# Patient Record
Sex: Female | Born: 1965 | Race: Black or African American | Hispanic: No | Marital: Single | Smoking: Never smoker
Health system: Southern US, Community
[De-identification: ages and names within clinical notes are randomized; demographics above are authoritative.]

## PROBLEM LIST (undated history)

## (undated) DIAGNOSIS — G8929 Other chronic pain: Secondary | ICD-10-CM

## (undated) DIAGNOSIS — M1990 Unspecified osteoarthritis, unspecified site: Secondary | ICD-10-CM

## (undated) DIAGNOSIS — I10 Essential (primary) hypertension: Secondary | ICD-10-CM

## (undated) DIAGNOSIS — K2960 Other gastritis without bleeding: Secondary | ICD-10-CM

## (undated) DIAGNOSIS — M549 Dorsalgia, unspecified: Secondary | ICD-10-CM

## (undated) HISTORY — PX: ABDOMINAL HYSTERECTOMY: SHX81

---

## 2003-11-30 ENCOUNTER — Emergency Department: Payer: Self-pay | Admitting: General Practice

## 2003-12-07 ENCOUNTER — Ambulatory Visit: Payer: Self-pay | Admitting: Orthopedic Surgery

## 2004-05-05 ENCOUNTER — Emergency Department: Payer: Self-pay | Admitting: Emergency Medicine

## 2004-08-07 ENCOUNTER — Emergency Department: Payer: Self-pay | Admitting: Emergency Medicine

## 2004-08-08 ENCOUNTER — Ambulatory Visit: Payer: Self-pay | Admitting: Emergency Medicine

## 2004-08-13 ENCOUNTER — Emergency Department: Payer: Self-pay | Admitting: Unknown Physician Specialty

## 2004-08-24 ENCOUNTER — Inpatient Hospital Stay: Payer: Self-pay | Admitting: Obstetrics and Gynecology

## 2004-10-03 ENCOUNTER — Inpatient Hospital Stay: Payer: Self-pay

## 2004-11-15 ENCOUNTER — Inpatient Hospital Stay: Payer: Self-pay | Admitting: Obstetrics and Gynecology

## 2005-02-14 ENCOUNTER — Ambulatory Visit: Payer: Self-pay | Admitting: Obstetrics and Gynecology

## 2005-04-05 ENCOUNTER — Emergency Department: Payer: Self-pay | Admitting: Emergency Medicine

## 2005-05-07 ENCOUNTER — Emergency Department: Payer: Self-pay | Admitting: Emergency Medicine

## 2005-05-15 ENCOUNTER — Emergency Department: Payer: Self-pay | Admitting: Emergency Medicine

## 2005-10-07 ENCOUNTER — Emergency Department: Payer: Self-pay | Admitting: Internal Medicine

## 2005-10-08 ENCOUNTER — Ambulatory Visit: Payer: Self-pay

## 2005-11-12 ENCOUNTER — Inpatient Hospital Stay: Payer: Self-pay | Admitting: Obstetrics and Gynecology

## 2005-12-31 ENCOUNTER — Emergency Department: Payer: Self-pay | Admitting: Internal Medicine

## 2005-12-31 ENCOUNTER — Other Ambulatory Visit: Payer: Self-pay

## 2007-03-05 ENCOUNTER — Emergency Department: Payer: Self-pay | Admitting: Emergency Medicine

## 2007-03-06 ENCOUNTER — Ambulatory Visit: Payer: Self-pay | Admitting: Emergency Medicine

## 2007-04-13 ENCOUNTER — Emergency Department: Payer: Self-pay | Admitting: Emergency Medicine

## 2007-10-21 ENCOUNTER — Emergency Department: Payer: Self-pay | Admitting: Emergency Medicine

## 2008-01-02 ENCOUNTER — Emergency Department: Payer: Self-pay | Admitting: Emergency Medicine

## 2008-08-10 ENCOUNTER — Emergency Department: Payer: Self-pay | Admitting: Emergency Medicine

## 2008-08-13 ENCOUNTER — Ambulatory Visit: Payer: Self-pay | Admitting: Internal Medicine

## 2009-03-12 ENCOUNTER — Emergency Department: Payer: Self-pay | Admitting: Emergency Medicine

## 2009-06-22 ENCOUNTER — Emergency Department: Payer: Self-pay | Admitting: Emergency Medicine

## 2009-06-26 ENCOUNTER — Emergency Department: Payer: Self-pay | Admitting: Emergency Medicine

## 2009-07-01 ENCOUNTER — Ambulatory Visit: Payer: Self-pay | Admitting: Internal Medicine

## 2009-08-10 ENCOUNTER — Emergency Department: Payer: Self-pay | Admitting: Emergency Medicine

## 2009-10-20 ENCOUNTER — Emergency Department: Payer: Self-pay | Admitting: Emergency Medicine

## 2010-01-13 ENCOUNTER — Emergency Department: Payer: Self-pay | Admitting: Emergency Medicine

## 2010-03-01 ENCOUNTER — Emergency Department: Payer: Self-pay | Admitting: Emergency Medicine

## 2010-07-30 ENCOUNTER — Emergency Department: Payer: Self-pay | Admitting: Emergency Medicine

## 2011-02-20 LAB — COMPREHENSIVE METABOLIC PANEL
Albumin: 3.6 g/dL (ref 3.4–5.0)
Alkaline Phosphatase: 63 U/L (ref 50–136)
Calcium, Total: 8.9 mg/dL (ref 8.5–10.1)
Co2: 26 mmol/L (ref 21–32)
EGFR (African American): >60
EGFR (Non-African Amer.): >60
Glucose: 86 mg/dL (ref 65–99)
Osmolality: 283 (ref 275–301)
SGOT(AST): 14 U/L — ABNORMAL LOW (ref 15–37)
SGPT (ALT): 16 U/L

## 2011-02-20 LAB — URINALYSIS, COMPLETE
Bacteria: NONE SEEN
Glucose,UR: NEGATIVE mg/dL (ref 0–75)
Ketone: NEGATIVE
Nitrite: NEGATIVE
RBC,UR: 1 /HPF (ref 0–5)
Specific Gravity: 1.015 (ref 1.003–1.030)
WBC UR: 4 /HPF (ref 0–5)

## 2011-02-20 LAB — CBC
HCT: 37.4 % (ref 35.0–47.0)
HGB: 12 g/dL (ref 12.0–16.0)
MCHC: 32 g/dL (ref 32.0–36.0)
MCV: 79 fL — ABNORMAL LOW (ref 80–100)
Platelet: 370 10*3/uL (ref 150–440)
RBC: 4.76 10*6/uL (ref 3.80–5.20)
WBC: 9.4 10*3/uL (ref 3.6–11.0)

## 2011-02-20 LAB — LIPASE, BLOOD: Lipase: 109 U/L (ref 73–393)

## 2011-02-21 ENCOUNTER — Observation Stay: Payer: Self-pay | Admitting: Internal Medicine

## 2011-02-21 LAB — RAPID INFLUENZA A&B ANTIGENS

## 2011-02-23 LAB — CBC WITH DIFFERENTIAL/PLATELET
Basophil #: 0 10*3/uL (ref 0.0–0.1)
Basophil %: 0.5 %
Eosinophil %: 1.3 %
HCT: 36 % (ref 35.0–47.0)
HGB: 11.6 g/dL — ABNORMAL LOW (ref 12.0–16.0)
MCH: 25.4 pg — ABNORMAL LOW (ref 26.0–34.0)
MCHC: 32.3 g/dL (ref 32.0–36.0)
MCV: 79 fL — ABNORMAL LOW (ref 80–100)
Monocyte #: 0.6 10*3/uL (ref 0.0–0.7)
Monocyte %: 8.4 %
Neutrophil %: 58.6 %
Platelet: 330 10*3/uL (ref 150–440)
RBC: 4.58 10*6/uL (ref 3.80–5.20)
WBC: 7.4 10*3/uL (ref 3.6–11.0)

## 2011-02-23 LAB — BASIC METABOLIC PANEL
Calcium, Total: 8.8 mg/dL (ref 8.5–10.1)
Chloride: 108 mmol/L — ABNORMAL HIGH (ref 98–107)
Co2: 27 mmol/L (ref 21–32)
Osmolality: 285 (ref 275–301)
Potassium: 4 mmol/L (ref 3.5–5.1)
Sodium: 145 mmol/L (ref 136–145)

## 2011-02-24 LAB — URINE CULTURE

## 2014-05-09 NOTE — Consult Note (Signed)
PATIENT NAME:  Linda Giles, Linda Giles MR#:  [id number] DATE OF BIRTH:  10-18-1965  DATE OF CONSULTATION:  02/21/2011  REFERRING PHYSICIAN:   CONSULTING PHYSICIAN:  Keturah Barrehristiane H. Yenifer Saccente, NP  PRIMARY CARE PHYSICIAN: None.  HISTORY OF PRESENT ILLNESS: Linda Giles is a 49 year old African American lady who has recently been admitted with nausea, vomiting and diarrhea. Please see the History and Physical for full admit details. Gastroenterology has been requested by Dr. Hilda LiasVivek Sainani for evaluation of her nausea, vomiting, diarrhea, and abdominal pain. The patient reports an onset of fatigue and malaise about a week ago, low-grade fever on Saturday and Sunday. Diarrhea started Saturday evening. She states she thought she had eaten something bad, but then started vomiting later that night which has continued until this morning, with the diarrhea. She states that her vomit was dark brown and her stools were dark as well with flecks of blood. The blood was last seen Sunday. She reports generalized abdominal pain that is throbbing in nature. No fever today. Reports that she had [employer] on Thursday and [employer] on Saturday. She uses city water and has a dog. No recent antibiotics or foreign travel. She the Production designer, theatre/television/filmmanager at [employer]. She does report limited but regular use of ibuprofen for intermittent stress headaches. Also reports heartburn, acid reflux, burping, and decreased appetite. She has not been using PPI at home for some time. She has not had endoscopic evaluation in past. Additionally, her mother was diagnosed early with colon cancer, had resection, and did not require chemotherapy. She is still living. I noted that stool studies has been ordered to include C. difficile, ova and parasite, and culture. I also noted pantoprazole IV twice a day and IV Zofran. In review of her labs, they are essentially normal, however, on her CBC she does have some microcytosis. Flu test was negative for flu A and flu B. CT  without contrast did not demonstrate any acute problems and the chest x-ray found no abnormalities   ALLERGIES: Compazine   PAST MEDICAL HISTORY:  1. Hypertension. 2. Depression. 3. Panic attacks. 4. Hysterectomy.   SOCIAL HISTORY: No smoking, EtOH, or illicits. Works as a Production designer, theatre/television/filmmanager at [employer].   FAMILY HISTORY: History is significant for colon cancer in her mother who was diagnosed 20 years ago. Also had peptic ulcer disease. No known history of liver disease or IBD. Mother additionally with cervical cancer.   CURRENT MEDICATIONS: None, she was on antidepressants and antihypertensives, but is not taking them now.   REVIEW OF SYSTEMS: CONSTITUTIONAL: Weight stable. Fatigue, malaise, and fever as noted. HEENT: No blurry or double vision, tinnitus, postnasal drip, sore throat, or difficulty swallowing. RESPIRATORY: No cough, wheeze, or dyspnea, CARDIOVASCULAR: No chest pain, palpitations, murmur, or syncope. GASTROINTESTINAL: As noted. GU: No dysuria or hematuria. ENDOCRINE: No history of diabetes, polyuria, nocturia, or hypothyroidism. No hot or cold intolerances. HEME: No known history of anemia, easy bruising, or bleeding. INTEGUMENTARY: No rash or lesions. MUSCULOSKELETAL: No history of arthritis, swelling or gout. NEUROLOGIC: No history of CVA, numbness, tingling, fainting, dizziness, or seizures. PSYCH: No history of anxiety or insomnia. Does have history of depression and does not have any complaints about this at this time.   PHYSICAL EXAM:   VITALS: Temperature 96.5, pulse 62, respiratory rate 18, blood pressure 133/67, and oxygen saturation 98% on room air.   GENERAL: Pleasant-appearing woman in no acute distress.   HEENT: Normocephalic, atraumatic. No redness, drainage, or inflammation to the eyes or the nares. No icterus  to the sclerae. Oral membranes are pink and moist.   PSYCH: Pleasant, logical thought, cooperative.   NECK: Supple. No JVD, lymphadenopathy, or thyromegaly.    PULMONARY: Respirations eupneic. Lungs CTAB.   CARDIAC: S1 and S2, regular rate and rhythm. No MRG. Peripheral pluses 2+ bilaterally. No edema.   ABDOMEN: Protuberant, hypoactive bowel sounds x4, soft, and generalized diffuse tenderness without rebound tenderness, peritoneal signs, hepatosplenomegaly, hernias, or masses.   RECTAL: No abnormalities. Stool brown, heme-negative.   GU: Normal appearing female genitalia.   SKIN: Warm, dry, and pink. No erythema, lesion, or rash.   NEUROLOGICAL: Alert and oriented x3. Cranial nerves II through XII grossly intact. No facial droop. Speech clear.   EXTREMITIES: No cyanosis, clubbing, or peripheral edema. Gait steady. Strength 5/5. Sensation intact.   IMPRESSION AND PLAN:  1. Likely gastroenteritis. This could have a viral versus a bacterial component. Noted stool studies pending and agree with the fluids and antiemetics. We will rechallenge with a clear liquid diet. 2. Gastroesophageal reflux. Agree with bid IV pantoprazole. With this, the NSAIDs, and the family history of colon cancer we will plan for outpatient luminal evaluation once the patient is feeling better.   These services were provided by Vevelyn Pat, MSN, NPC in collaboration with Christena Deem, MD.  ____________________________ Keturah Barre, NP chl:slb D: 02/21/2011 17:23:49 ET T: 02/21/2011 17:38:04 ET JOB#: [id number]  cc: Keturah Barre, NP, <Dictator> Eustaquio Maize Mercadez Heitman FNP ELECTRONICALLY SIGNED 02/22/2011 13:48

## 2014-05-09 NOTE — Discharge Summary (Signed)
PATIENT NAME:  Linda Giles, Linda Giles#:  [id number] DATE OF BIRTH:  Apr 03, 1965  DATE OF ADMISSION:  02/21/2011 DATE OF DISCHARGE:  02/23/2011  ADMITTING DIAGNOSES: Abdominal pain, nausea, vomiting and diarrhea.   DISCHARGE DIAGNOSES:  1. Abdominal pain, nausea, vomiting and diarrhea, possibly due to acute gastroenteritis with gastritis, now symptoms improved.  2. History of hypertension, currently not on any antihypertensives, blood pressure has been stable.  3. History of depression, currently not in any depression medications.  4. History of panic attacks.   LABORATORY, DIAGNOSTIC AND RADIOLOGICAL DATA: Serum glucose 86, BUN 10, creatinine 0.8, sodium 143, potassium 3.7, chloride 105, bicarbonate 26. LFTs normal. WBC 9.4, hemoglobin 12, platelet count 370. Urinalysis showed 2+ leukocyte esterase with only 4 WBCs. Urine culture showed no growth. CT of the abdomen and pelvis showed no urolithiasis. No acute abdominal or pelvic pathology. PA and lateral chest x-ray showed no acute abnormality. Lipase 109.   CONSULTANT: GI, Dr. Marva Giles.   HOSPITAL COURSE: Please see history and physical done by the admitting physician. Patient is a 49 year old African American female came into the ED with complaint of nausea, vomiting, and diarrhea. Patient reports that these episodes started for the past 2 to 3 days. She also had abdominal pain therefore was evaluated in the ED with a CT of the abdomen which was negative. She had LFTs that were normal. Lipase was normal. Due to her symptoms, we were asked to admit the patient. Patient was admitted and was provided with supportive care, antiemetics, IV fluids. She was seen by GI. They recommended continuing supportive care. Patient's symptoms started to improve, her diarrhea is resolved, her nausea and emesis is improved. She is able to tolerate some clears. We are going to advance her diet, if she is able to tolerate that she will be discharged later today.    DISCHARGE MEDICATIONS:  1. Ultram 50 q.4 p.r.n. for pain. 2. Dicyclomine 20, 1 tab p.o. 4 times per day p.r.n.  3. Zofran 4 mg t.i.d. as needed. 4. Phenergan 12.5 mg p.o. every six hours p.r.n. nausea.  5. Percocet 5/325 p.o. q.8 p.r.n. pain. 6. Esomeprazole 20 mg p.o. daily.   DISCHARGE DIET: Low sodium, low fat.   ACTIVITY: As tolerated.   TIMEFRAME FOR FOLLOW UP: 1 to 2 weeks with [facility] as a new patient.   TIME SPENT: 35 minutes.  ____________________________ Lacie ScottsShreyang H. Allena KatzPatel, MD shp:cms D: 02/23/2011 13:00:25 ET T: 02/23/2011 13:16:37 ET JOB#: [id number]  cc: Katelee Schupp H. Allena KatzPatel, MD, <Dictator> Charise CarwinSHREYANG H Moshe Wenger MD ELECTRONICALLY SIGNED 03/03/2011 10:03

## 2014-05-09 NOTE — Consult Note (Signed)
Chief Complaint:   Subjective/Chief Complaint Patient seen and examined, please seee full GI consult.  Patietn admitted with diarrheal illness/ n/v.  Feeling some better, no emesis since this am no diarrhea today.  Awaiting stool studies.  Of note patient has some microcytosis with borderline hemoglobin, in the setting of family history of colon cancer in her moter at a young age.  REcommend luminal evaluation  once feeling some better, can be done as outpatient. Following.   Electronic Signatures: Barnetta ChapelSkulskie, Armonie Mettler (MD)  (Signed 06-Feb-13 20:59)  Authored: Chief Complaint   Last Updated: 06-Feb-13 20:59 by Barnetta ChapelSkulskie, Tonika Eden (MD)

## 2014-05-09 NOTE — Consult Note (Signed)
Brief Consult Note: Diagnosis: NVD.   Patient was seen by consultant.   Consult note dictated.   Discussed with Attending MD.   Comments: Appreciate consult for 49 y/o [place] woman admitted with NVD and abdominal pain for same. Reports onset of fatigue/malaise about a week ago, low grade fever Saturday and Sunday. States diarrhea started Saturday evening: thought she had eaten something bad, but then started vomiting later that nigh, which has continued until this morning. States that her vomit was dark brown, and her stools was dark as well with flecks of blood, last seen Sunday. States abdominal pain is generalized and throbbing in nature. No fever today. States she had [employer] on Thursday, [employer] Saturday. Uses city water, has a dog, no recent antibiotics or foreign travel. 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 Does have some heartburn, acid reflux, burping, and decreased appetite. Has not been using PPI at home for some time. Mother diagnosed early with colon cancer, had resection, did not require chemo, and is still living. Stool brownish, heme negative today. Impression: gastroenteritis- noted stool studies pending and agree with this, fluids, antiemetics. this could have a viral v. bacterial component. Will rechallenge with CLIQ diet.                   Gerd: agree with bid IV pantoprazole. With this, NSAIDs, and family history of colon cancer, will plan for outpt luminal evaluation when patient feeling better  Electronic Signatures: Vevelyn PatLondon, Eunice Winecoff H (NP)  (Signed 06-Feb-13 17:14)  Authored: Brief Consult Note   Last Updated: 06-Feb-13 17:14 by Keturah BarreLondon, Arionne Iams H (NP)

## 2014-05-09 NOTE — H&P (Signed)
PATIENT NAME:  Linda Giles, Linda Giles MR#:  [id number] DATE OF BIRTH:  08-28-1965  DATE OF ADMISSION:  02/21/2011  PRIMARY CARE PHYSICIAN: Does not have one.   CHIEF COMPLAINT: Abdominal pain, nausea, and vomiting with also diarrhea.   HISTORY OF PRESENT ILLNESS: This is a 49 year old female who comes in from home complaining of nausea, vomiting, and diarrhea. The patient says that she has had multiple episodes of nausea and vomiting since the past 2 or 3 days. She says it has no relation to when she eats or if she does not eat. Every time she tries to eat, even liquids, it comes right back up. She has not been able to keep anything down for the past 2 to 3 days. She also complains of abdominal pain mainly in the lower abdomen described as sharp in nature, nonradiating. She also admits to diarrhea which has been loose and watery in nature. She also admits to some specks of blood in her stool. Given the fact that her symptoms are not improving, she came to the ER for further evaluation. In the Emergency Room, the patient has received multiple doses of antiemetics and also pain control with not much improvement in her symptoms. Hospitalist service was then contacted for further treatment and evaluation.   REVIEW OF SYSTEMS: CONSTITUTIONAL: No documented fever. No weight gain, no weight loss. EYES: No blurry or double vision. ENT: No tinnitus or postnasal drip. No redness of the oropharynx. RESPIRATORY: No cough, no wheeze, no hemoptysis, no dyspnea. CARDIOVASCULAR: No chest pain, no orthopnea, no palpitations, no syncope. GI: Positive nausea. Positive vomiting. Positive generalized abdominal pain. No melena. No hematochezia. GU: No dysuria or hematuria. ENDOCRINE: No polyuria or nocturia. No heat or cold intolerance. HEME: No anemia. No bruising. No bleeding. INTEGUMENTARY: No rashes. No lesions. MUSCULOSKELETAL: No arthritis, no swelling, no gout. NEUROLOGIC: No numbness, no tingling, no ataxia, no seizure-type  activity. PSYCH: No anxiety, no insomnia, no ADD.   PAST MEDICAL HISTORY:  1. Hypertension.  2. Depression. 3. Panic attacks.   ALLERGIES: Compazine.   SOCIAL HISTORY: No smoking. No alcohol abuse. No illicit drug abuse. 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   FAMILY HISTORY: Family history is strong for diabetes on the mother's side and also history of cancer. Mother has colon cancer and history of cervical cancer but is still alive.   CURRENT MEDICATIONS: The patient is supposed to be taking medications for blood pressure and depression although she has not taken them in quite a while.   PHYSICAL EXAMINATION ON ADMISSION:   VITAL SIGNS: Temperature 99, pulse 51, respirations 16, blood pressure 140/70, sats 99% on room air.   GENERAL: She is a pleasant appearing female in no apparent distress.   HEENT: Atraumatic, normocephalic. Extraocular muscles are intact. Pupils are equal and reactive to light. Sclerae anicteric. No conjunctival injection. No oropharyngeal erythema.   NECK: Supple. No jugular venous distention. No bruits. No lymphadenopathy or thyromegaly.   HEART: Regular rate and rhythm. No murmurs, no rubs, no clicks.   LUNGS: Clear to auscultation bilaterally. No rales, no rhonchi, no wheezes.   ABDOMEN: Soft, tender diffusely. No rebound. No rigidity. Hypoactive bowel sounds. No hepatosplenomegaly appreciated.   EXTREMITIES: No evidence of any cyanosis, clubbing, or peripheral edema. Has +2 pedal and radial pulses bilaterally.   NEUROLOGICAL: The patient is alert, awake, and oriented x3 with no focal motor or sensory deficits appreciated.   SKIN: Moist and warm with no rash appreciated.   LYMPHATIC:  There is no cervical or axillary lymphadenopathy.    LABORATORY, DIAGNOSTIC, AND RADIOLOGICAL DATA: Serum glucose 86, BUN 10, creatinine 0.8, sodium 143, potassium 3.7, chloride 105, bicarb 26. LFTs are within normal limits. White cell count 9.4, hemoglobin  12, hematocrit 37.4, platelet count 370. Urinalysis shows 2+ leukocyte esterase with 4 white cells, no bacteria. The patient did undergo a CT scan of the abdomen and pelvis without contrast which showed no urolithiasis, no obstructive uropathy. No acute abdominal or pelvic pathology.   ASSESSMENT AND PLAN: This is a 49 year old female with past medical history of hypertension, anxiety/depression who presents to the hospital with abdominal pain, nausea, vomiting, and diarrhea.  1. Abdominal pain, nausea, and vomiting. The exact etiology of this is unclear, questionable if this is a viral illness versus irritable bowel syndrome versus colitis. CT scan of the abdomen and pelvis without contrast does not show any acute pathology. Her lipase is normal. Her LFTs are normal. The patient has gotten multiple doses of antiemetics and pain meds but still remains symptomatic. I will observe her overnight. Continue supportive care with IV fluids, antiemetics, and pain control. Will get a GI consult. Will put her on Protonix b.i.d. for now.  2. Diarrhea. Supportive care for now with some p.r.n. Imodium. Will check stool for comprehensive culture, ova and parasites, and C. difficile.  3. Hypertension. The patient presently is not on any meds. Will continue p.r.n. hydralazine for now.  4. Anxiety/depression. The patient is currently on no meds. She denies any suicidal or homicidal ideations presently.   CODE STATUS: The patient is a FULL CODE.   TIME SPENT: 45 minutes.   ____________________________ Rolly Pancake. Cherlynn Kaiser, MD vjs:drc D: 02/21/2011 08:37:07 ET T: 02/21/2011 08:52:59 ET JOB#: [id number]  cc: Rolly Pancake. Cherlynn Kaiser, MD, <Dictator> Houston Siren MD ELECTRONICALLY SIGNED 02/21/2011 11:55

## 2015-08-30 ENCOUNTER — Emergency Department: Payer: Self-pay

## 2015-08-30 ENCOUNTER — Emergency Department
Admission: EM | Admit: 2015-08-30 | Discharge: 2015-08-30 | Disposition: A | Discharge status: Home / Self Care | Payer: Self-pay | Attending: Emergency Medicine | Admitting: Emergency Medicine

## 2015-08-30 ENCOUNTER — Encounter: Payer: Self-pay | Admitting: Emergency Medicine

## 2015-08-30 DIAGNOSIS — M546 Pain in thoracic spine: Secondary | ICD-10-CM

## 2015-08-30 DIAGNOSIS — I10 Essential (primary) hypertension: Secondary | ICD-10-CM | POA: Insufficient documentation

## 2015-08-30 DIAGNOSIS — Y929 Unspecified place or not applicable: Secondary | ICD-10-CM | POA: Insufficient documentation

## 2015-08-30 DIAGNOSIS — X58XXXA Exposure to other specified factors, initial encounter: Secondary | ICD-10-CM | POA: Insufficient documentation

## 2015-08-30 DIAGNOSIS — T148XXA Other injury of unspecified body region, initial encounter: Secondary | ICD-10-CM

## 2015-08-30 DIAGNOSIS — Y999 Unspecified external cause status: Secondary | ICD-10-CM | POA: Insufficient documentation

## 2015-08-30 DIAGNOSIS — Y939 Activity, unspecified: Secondary | ICD-10-CM | POA: Insufficient documentation

## 2015-08-30 DIAGNOSIS — S29012A Strain of muscle and tendon of back wall of thorax, initial encounter: Secondary | ICD-10-CM | POA: Insufficient documentation

## 2015-08-30 HISTORY — DX: Essential (primary) hypertension: I10

## 2015-08-30 MED ORDER — CYCLOBENZAPRINE HCL 10 MG PO TABS
10.0000 mg | ORAL_TABLET | Freq: Once | ORAL | Status: AC
  Administered 2015-08-30: 10 mg via ORAL
  Filled 2015-08-30: qty 1

## 2015-08-30 MED ORDER — CYCLOBENZAPRINE HCL 5 MG PO TABS: 5.0000 mg | ORAL_TABLET | Freq: Three times a day (TID) | ORAL | 0 refills | Status: DC | PRN

## 2015-08-30 NOTE — ED Triage Notes (Signed)
Presents with mid back pain off and on for a while  Became worse of Friday  States pain radiates into right leg  Ambulates with sl limp d/t pain.no injury

## 2015-08-30 NOTE — ED Provider Notes (Signed)
[facility]-EMERGENCY DEPARTMENT Provider Note   CSN: [id number] Arrival date & time: 08/30/15  1739     History   Chief Complaint Chief Complaint  Patient presents with  . Back Pain    HPI Linda Giles is a 50 y.o. female.Presents to the emergency department for evaluation of lower back pain. She points to the mid thoracic spine on the spinous process of T8-T9. She's had been tightness and muscle spasms. No trauma or injury. Her symptoms been on and off for 2 weeks. She works at assisted living facility, performing a lot of lifting. She denies any numbness tingling radicular symptoms or weakness of lower extremities. No loss of bowel or bladder symptoms. [facility], prescribed ibuprofen, no imaging was obtained. She has not been taking the ibuprofen. Her pain is described as 10 out of 10 and muscle spasms and increased with movement  HPI  Past Medical History:  Diagnosis Date  . Hypertension     There are no active problems to display for this patient.   Past Surgical History:  Procedure Laterality Date  . ABDOMINAL HYSTERECTOMY      OB History    No data available       Home Medications    Prior to Admission medications   Medication Sig Start Date End Date Taking? Authorizing Provider  cyclobenzaprine (FLEXERIL) 5 MG tablet Take 1-2 tablets (5-10 mg total) by mouth 3 (three) times daily as needed for muscle spasms. 08/30/15   Evon Slackhomas C Ala Kratz, PA-C    Family History No family history on file.  Social History Social History  Substance Use Topics  . Smoking status: Never Smoker  . Smokeless tobacco: Never Used  . Alcohol use No     Allergies   Compazine [prochlorperazine edisylate]   Review of Systems Review of Systems  Constitutional: Negative for activity change, chills, fatigue and fever.  HENT: Negative for congestion, sinus pressure and sore throat.   Eyes: Negative for visual disturbance.  Respiratory: Negative for cough, chest  tightness and shortness of breath.   Cardiovascular: Negative for chest pain and leg swelling.  Gastrointestinal: Negative for abdominal pain, diarrhea, nausea and vomiting.  Genitourinary: Negative for dysuria.  Musculoskeletal: Positive for back pain. Negative for arthralgias and gait problem.  Skin: Negative for rash.  Neurological: Negative for weakness, numbness and headaches.  Hematological: Negative for adenopathy.  Psychiatric/Behavioral: Negative for agitation, behavioral problems and confusion.     Physical Exam Updated Vital Signs BP (!) 157/83 (BP Location: Left Arm)   Pulse 79   Temp 98.4 F (36.9 C) (Oral)   Resp 18   Ht 5\' 6"  (1.676 m)   Wt 88.5 kg   SpO2 99%   BMI 31.47 kg/m   Physical Exam  Constitutional: She is oriented to person, place, and time. She appears well-developed and well-nourished. No distress.  HENT:  Head: Normocephalic and atraumatic.  Mouth/Throat: Oropharynx is clear and moist.  Eyes: EOM are normal. Pupils are equal, round, and reactive to light. Right eye exhibits no discharge. Left eye exhibits no discharge.  Neck: Normal range of motion. Neck supple.  Cardiovascular: Normal rate, regular rhythm and intact distal pulses.   Pulmonary/Chest: Effort normal and breath sounds normal. No respiratory distress. She exhibits no tenderness.  Abdominal: Soft. She exhibits no distension. There is no tenderness.  Musculoskeletal: Normal range of motion. She exhibits no edema.  Examination of thoracic and lumbar spine shows patient has spinous process tenderness at T8-T9.  There is left and right paravertebral muscle tenderness that is mild. There is no muscle spasms noted. Patient has full range of motion of lumbar thoracic and cervical spine. She has full range of motion of the lower extremities with no weakness or neurological deficits.  Neurological: She is alert and oriented to person, place, and time. She has normal reflexes.  Skin: Skin is warm and  dry.  Psychiatric: She has a normal mood and affect. Her behavior is normal. Thought content normal.     ED Treatments / Results  Labs (all labs ordered are listed, but only abnormal results are displayed) Labs Reviewed - No data to display  EKG  EKG Interpretation None       Radiology Dg Thoracic Spine 2 View  Result Date: 08/30/2015 CLINICAL DATA:  Mid back pain off and on for awhile but worsening last Friday. Radiates to right leg. EXAM: THORACIC SPINE 2 VIEWS COMPARISON:  07/30/2010 FINDINGS: There is no evidence of thoracic spine fracture. Alignment is normal. No other significant bone abnormalities are identified. IMPRESSION: Negative. Electronically Signed   By: Kennith CenterEric  Mansell M.D.   On: 08/30/2015 19:03    Procedures Procedures (including critical care time)  Medications Ordered in ED Medications  cyclobenzaprine (FLEXERIL) tablet 10 mg (10 mg Oral Given 08/30/15 1842)     Initial Impression / Assessment and Plan / ED Course  I have reviewed the triage vital signs and the nursing notes.  Pertinent labs & imaging results that were available during my care of the patient were reviewed by me and considered in my medical decision making (see chart for details).  Clinical Course    50 year old female with 2 weeks of mid thoracic back pain with left and right paravertebral muscle tenderness. [facility], no imaging obtained. Today's x-ray showed no evidence of acute bony abnormality or compression deformity. Recommend she start ibuprofen [facility], she is given a muscle relaxer today. She will follow-up with orthopedics if no improvement in 5-7 days.  Final Clinical Impressions(s) / ED Diagnoses   Final diagnoses:  Muscle strain  Midline thoracic back pain    New Prescriptions New Prescriptions   CYCLOBENZAPRINE (FLEXERIL) 5 MG TABLET    Take 1-2 tablets (5-10 mg total) by mouth 3 (three) times daily as needed for muscle spasms.       Evon Slackhomas C Aidenn Skellenger, PA-C 08/30/15 1942    Jennye MoccasinBrian S Quigley, MD 08/31/15 [phone]

## 2016-02-11 ENCOUNTER — Emergency Department: Payer: Self-pay

## 2016-02-11 ENCOUNTER — Observation Stay
Admission: EM | Admit: 2016-02-11 | Discharge: 2016-02-12 | Disposition: A | Discharge status: Home / Self Care | Payer: Self-pay | Attending: Internal Medicine | Admitting: Internal Medicine

## 2016-02-11 ENCOUNTER — Encounter: Payer: Self-pay | Admitting: Emergency Medicine

## 2016-02-11 DIAGNOSIS — E785 Hyperlipidemia, unspecified: Secondary | ICD-10-CM | POA: Insufficient documentation

## 2016-02-11 DIAGNOSIS — Z7982 Long term (current) use of aspirin: Secondary | ICD-10-CM | POA: Insufficient documentation

## 2016-02-11 DIAGNOSIS — R0789 Other chest pain: Principal | ICD-10-CM | POA: Insufficient documentation

## 2016-02-11 DIAGNOSIS — Z90710 Acquired absence of both cervix and uterus: Secondary | ICD-10-CM | POA: Insufficient documentation

## 2016-02-11 DIAGNOSIS — R52 Pain, unspecified: Secondary | ICD-10-CM

## 2016-02-11 DIAGNOSIS — Z8249 Family history of ischemic heart disease and other diseases of the circulatory system: Secondary | ICD-10-CM | POA: Insufficient documentation

## 2016-02-11 DIAGNOSIS — I10 Essential (primary) hypertension: Secondary | ICD-10-CM | POA: Insufficient documentation

## 2016-02-11 DIAGNOSIS — R079 Chest pain, unspecified: Secondary | ICD-10-CM | POA: Diagnosis present

## 2016-02-11 LAB — CBC WITH DIFFERENTIAL/PLATELET
BASOS ABS: 0.1 10*3/uL (ref 0–0.1)
Basophils Relative: 1 %
EOS PCT: 1 %
Eosinophils Absolute: 0.1 10*3/uL (ref 0–0.7)
HEMATOCRIT: 36.5 % (ref 35.0–47.0)
Hemoglobin: 12.2 g/dL (ref 12.0–16.0)
LYMPHS ABS: 2.1 10*3/uL (ref 1.0–3.6)
LYMPHS PCT: 21 %
MCH: 25.6 pg — AB (ref 26.0–34.0)
MCHC: 33.5 g/dL (ref 32.0–36.0)
MCV: 76.5 fL — AB (ref 80.0–100.0)
MONO ABS: 0.6 10*3/uL (ref 0.2–0.9)
MONOS PCT: 6 %
NEUTROS ABS: 6.8 10*3/uL — AB (ref 1.4–6.5)
Neutrophils Relative %: 71 %
Platelets: 398 10*3/uL (ref 150–440)
RBC: 4.77 MIL/uL (ref 3.80–5.20)
RDW: 14.4 % (ref 11.5–14.5)
WBC: 9.6 10*3/uL (ref 3.6–11.0)

## 2016-02-11 LAB — BASIC METABOLIC PANEL
ANION GAP: 8 (ref 5–15)
BUN: 10 mg/dL (ref 6–20)
CALCIUM: 9.3 mg/dL (ref 8.9–10.3)
CO2: 30 mmol/L (ref 22–32)
Chloride: 100 mmol/L — ABNORMAL LOW (ref 101–111)
Creatinine, Ser: 1.08 mg/dL — ABNORMAL HIGH (ref 0.44–1.00)
GFR calc Af Amer: >60 mL/min (ref >60)
GFR calc non Af Amer: 60 mL/min — ABNORMAL LOW (ref >60)
GLUCOSE: 110 mg/dL — AB (ref 65–99)
Potassium: 3.2 mmol/L — ABNORMAL LOW (ref 3.5–5.1)
Sodium: 138 mmol/L (ref 135–145)

## 2016-02-11 LAB — MAGNESIUM: Magnesium: 2 mg/dL (ref 1.7–2.4)

## 2016-02-11 LAB — TROPONIN I
Troponin I: <0.03 ng/mL (ref <0.03)
Troponin I: <0.03 ng/mL (ref <0.03)
Troponin I: <0.03 ng/mL (ref <0.03)

## 2016-02-11 MED ORDER — ONDANSETRON HCL 4 MG/2ML IJ SOLN: 4.0000 mg | Freq: Four times a day (QID) | INTRAMUSCULAR | Status: DC | PRN

## 2016-02-11 MED ORDER — POTASSIUM CHLORIDE CRYS ER 20 MEQ PO TBCR
20.0000 meq | EXTENDED_RELEASE_TABLET | Freq: Two times a day (BID) | ORAL | Status: DC
  Administered 2016-02-11 – 2016-02-12 (×3): 20 meq via ORAL
  Filled 2016-02-11 (×3): qty 1

## 2016-02-11 MED ORDER — NITROGLYCERIN 0.4 MG SL SUBL: 0.4000 mg | SUBLINGUAL_TABLET | SUBLINGUAL | Status: DC | PRN

## 2016-02-11 MED ORDER — METOPROLOL TARTRATE 25 MG PO TABS
25.0000 mg | ORAL_TABLET | Freq: Two times a day (BID) | ORAL | Status: DC
  Administered 2016-02-12: 25 mg via ORAL
  Filled 2016-02-11: qty 1

## 2016-02-11 MED ORDER — ASPIRIN 81 MG PO CHEW
324.0000 mg | CHEWABLE_TABLET | Freq: Once | ORAL | Status: AC
  Administered 2016-02-11: 324 mg via ORAL
  Filled 2016-02-11: qty 4

## 2016-02-11 MED ORDER — ENOXAPARIN SODIUM 40 MG/0.4ML ~~LOC~~ SOLN
40.0000 mg | SUBCUTANEOUS | Status: DC
  Administered 2016-02-11: 40 mg via SUBCUTANEOUS
  Filled 2016-02-11: qty 0.4

## 2016-02-11 MED ORDER — ONDANSETRON HCL 4 MG PO TABS: 4.0000 mg | ORAL_TABLET | Freq: Four times a day (QID) | ORAL | Status: DC | PRN

## 2016-02-11 MED ORDER — ACETAMINOPHEN 650 MG RE SUPP: 650.0000 mg | Freq: Four times a day (QID) | RECTAL | Status: DC | PRN

## 2016-02-11 MED ORDER — IOPAMIDOL (ISOVUE-370) INJECTION 76%
100.0000 mL | Freq: Once | INTRAVENOUS | Status: AC | PRN
Start: 2016-02-11 — End: 2016-02-11
  Administered 2016-02-11: 100 mL via INTRAVENOUS

## 2016-02-11 MED ORDER — SODIUM CHLORIDE 0.9% FLUSH
3.0000 mL | Freq: Two times a day (BID) | INTRAVENOUS | Status: DC
  Administered 2016-02-11: 3 mL via INTRAVENOUS

## 2016-02-11 MED ORDER — ACETAMINOPHEN 325 MG PO TABS
650.0000 mg | ORAL_TABLET | Freq: Four times a day (QID) | ORAL | Status: DC | PRN
Start: 2016-02-11 — End: 2016-02-12
  Administered 2016-02-11: 650 mg via ORAL
  Filled 2016-02-11: qty 2

## 2016-02-11 MED ORDER — ASPIRIN EC 81 MG PO TBEC
81.0000 mg | DELAYED_RELEASE_TABLET | Freq: Every day | ORAL | Status: DC
  Administered 2016-02-12: 81 mg via ORAL
  Filled 2016-02-11: qty 1

## 2016-02-11 MED ORDER — ALUM & MAG HYDROXIDE-SIMETH 200-200-20 MG/5ML PO SUSP
30.0000 mL | Freq: Four times a day (QID) | ORAL | Status: DC | PRN
  Administered 2016-02-11: 30 mL via ORAL
  Filled 2016-02-11: qty 30

## 2016-02-11 MED ORDER — MORPHINE SULFATE (PF) 4 MG/ML IV SOLN: 2.0000 mg | INTRAVENOUS | Status: DC | PRN

## 2016-02-11 NOTE — ED Triage Notes (Signed)
Pt in via EMS with complaints of sudden onset of squeezing centralized chest pain without radiation.  Pt reports associate SOB, dizziness, N/V.  Pt also with new onset blurred vision and tremor in right arm.  Pt A/Ox4, vitals WDL at this time.

## 2016-02-11 NOTE — H&P (Signed)
[employer] [facility]   PATIENT NAME: Linda Giles    MR#:  [id number]  DATE OF BIRTH:  06-16-1965  DATE OF ADMISSION:  02/11/2016  PRIMARY CARE PHYSICIAN: No PCP Per Patient   REQUESTING/REFERRING PHYSICIAN: Dr. Nita Sickle  CHIEF COMPLAINT:   Chief Complaint  Patient presents with  . Chest Pain    HISTORY OF PRESENT ILLNESS:  Linda Giles  is a 51 y.o. female with a known history of Hypertension who presents to the hospital due to chest pain. Patient says she was at work when she suddenly developed substernal chest pain that radiated down her body associated with some tremors in the right arm and also a headache and some nausea and vomiting. Patient says the conglomerate of symptoms for about 3 hours and they have improved. She continues to have intermittent chest pain while in the emergency room. She's never had these symptoms before. She denies any cough congestion and fever chills or any other associated symptoms presently. Patient has a strong family history of heart disease and therefore the ER physicians referred for admission under observation for her chest pain.  PAST MEDICAL HISTORY:   Past Medical History:  Diagnosis Date  . Hypertension     PAST SURGICAL HISTORY:   Past Surgical History:  Procedure Laterality Date  . ABDOMINAL HYSTERECTOMY      SOCIAL HISTORY:   Social History  Substance Use Topics  . Smoking status: Never Smoker  . Smokeless tobacco: Never Used  . Alcohol use No    FAMILY HISTORY:   Family History  Problem Relation Age of Onset  . Heart disease Mother   . Hypertension Mother   . CVA Mother   . Colon cancer Mother   . Cervical cancer Mother     DRUG ALLERGIES:   Allergies  Allergen Reactions  . Compazine [Prochlorperazine Edisylate] Swelling    REVIEW OF SYSTEMS:   Review of Systems  Constitutional: Negative for fever and weight loss.  HENT: Negative for congestion, nosebleeds and  tinnitus.   Eyes: Negative for blurred vision, double vision and redness.  Respiratory: Negative for cough, hemoptysis and shortness of breath.   Cardiovascular: Positive for chest pain. Negative for orthopnea, leg swelling and PND.  Gastrointestinal: Negative for abdominal pain, diarrhea, melena, nausea and vomiting.  Genitourinary: Negative for dysuria, hematuria and urgency.  Musculoskeletal: Negative for falls and joint pain.  Neurological: Negative for dizziness, tingling, sensory change, focal weakness, seizures, weakness and headaches.  Endo/Heme/Allergies: Negative for polydipsia. Does not bruise/bleed easily.  Psychiatric/Behavioral: Negative for depression and memory loss. The patient is not nervous/anxious.     MEDICATIONS AT HOME:   Prior to Admission medications   Medication Sig Start Date End Date Taking? Authorizing Provider  pseudoephedrine (SUDAFED) 30 MG tablet Take 30 mg by mouth every 4 (four) hours as needed for congestion.   Yes Historical Provider, MD  vitamin B-12 (CYANOCOBALAMIN) 1000 MCG tablet Take 1,000 mcg by mouth daily.   Yes Historical Provider, MD  cyclobenzaprine (FLEXERIL) 5 MG tablet Take 1-2 tablets (5-10 mg total) by mouth 3 (three) times daily as needed for muscle spasms. Patient not taking: Reported on 02/11/2016 08/30/15   Evon Slack, PA-C      VITAL SIGNS:  Blood pressure (!) 169/84, pulse 74, temperature 98.1 F (36.7 C), temperature source Oral, resp. rate 18, height 5\' 8"  (1.727 m), weight 112 kg (247 lb), SpO2 100 %.  PHYSICAL EXAMINATION:  Physical Exam  GENERAL:  51 y.o.-year-old patient lying in the bed in no acute distress.  EYES: Pupils equal, round, reactive to light and accommodation. No scleral icterus. Extraocular muscles intact.  HEENT: Head atraumatic, normocephalic. Oropharynx and nasopharynx clear. No oropharyngeal erythema, moist oral mucosa  NECK:  Supple, no jugular venous distention. No thyroid enlargement, no  tenderness.  LUNGS: Normal breath sounds bilaterally, no wheezing, rales, rhonchi. No use of accessory muscles of respiration.  CARDIOVASCULAR: S1, S2 RRR. No murmurs, rubs, gallops, clicks.  ABDOMEN: Soft, nontender, nondistended. Bowel sounds present. No organomegaly or mass.  EXTREMITIES: No pedal edema, cyanosis, or clubbing. + 2 pedal & radial pulses b/l.   NEUROLOGIC: Cranial nerves II through XII are intact. No focal Motor or sensory deficits appreciated b/l PSYCHIATRIC: The patient is alert and oriented x 3.   SKIN: No obvious rash, lesion, or ulcer.   LABORATORY PANEL:   CBC  Recent Labs Lab 02/11/16 1232  WBC 9.6  HGB 12.2  HCT 36.5  PLT 398   ------------------------------------------------------------------------------------------------------------------  Chemistries   Recent Labs Lab 02/11/16 1232  NA 138  K 3.2*  CL 100*  CO2 30  GLUCOSE 110*  BUN 10  CREATININE 1.08*  CALCIUM 9.3   ------------------------------------------------------------------------------------------------------------------  Cardiac Enzymes  Recent Labs Lab 02/11/16 1232  TROPONINI <0.03   ------------------------------------------------------------------------------------------------------------------  RADIOLOGY:  Ct Head Wo Contrast  Result Date: 02/11/2016 CLINICAL DATA:  Right arm tremor EXAM: CT HEAD WITHOUT CONTRAST TECHNIQUE: Contiguous axial images were obtained from the base of the skull through the vertex without intravenous contrast. COMPARISON:  03/06/2007 FINDINGS: Brain: No acute intracranial abnormality. Specifically, no hemorrhage, hydrocephalus, mass lesion, acute infarction, or significant intracranial injury. Vascular: No hyperdense vessel or unexpected calcification. Skull: No acute calvarial abnormality. Sinuses/Orbits: Visualized paranasal sinuses and mastoids clear. Orbital soft tissues unremarkable. Other: None IMPRESSION: No intracranial abnormality.  Electronically Signed   By: Charlett Nose M.D.   On: 02/11/2016 13:57   Dg Chest Port 1 View  Result Date: 02/11/2016 CLINICAL DATA:  Pt in via EMS with complaints of sudden onset of squeezing centralized chest pain without radiation. Pt reports associate SOB, dizziness, N/V. Pt also with new onset blurred vision and tremor in right arm. Nonsmoker. EXAM: PORTABLE CHEST 1 VIEW COMPARISON:  02/20/2011 FINDINGS: The heart size and mediastinal contours are within normal limits. Both lungs are clear. No pleural effusion or pneumothorax. The visualized skeletal structures are unremarkable. IMPRESSION: No active disease. Electronically Signed   By: Amie Portland M.D.   On: 02/11/2016 13:21   Ct Angio Chest/abd/pel For Dissection W And/or Wo Contrast  Result Date: 02/11/2016 CLINICAL DATA:  Chest pain, right arm tremor, severe onset, concern for dissection EXAM: CT ANGIOGRAPHY CHEST, ABDOMEN AND PELVIS TECHNIQUE: Multidetector CT imaging through the chest, abdomen and pelvis was performed using the standard protocol during bolus administration of intravenous contrast. Multiplanar reconstructed images and MIPs were obtained and reviewed to evaluate the vascular anatomy. CONTRAST:  100 cc Isovue 370 COMPARISON:  02/20/2011 FINDINGS: CTA CHEST FINDINGS Cardiovascular: Intact thoracic aorta. Negative for aneurysm or dissection. Initial noncontrast imaging demonstrates no hyperdense intramural hemorrhage or hematoma. No mediastinal hemorrhage. Normal heart size. No pericardial effusion. Two vessel arch anatomy. Mediastinum/Nodes: No adenopathy. Thyroid and trachea unremarkable. Small hiatal hernia noted at the GE junction. Lungs/Pleura: Lungs are clear. No pleural effusion or pneumothorax. Musculoskeletal: Minor lower thoracic spondylosis and endplate osteophytes. No acute osseous finding. Review of the MIP images confirms the above findings. CTA ABDOMEN AND PELVIS FINDINGS VASCULAR Aorta: Minor  distal aortic  atherosclerosis. Negative for aneurysm, dissection, or occlusive process. Celiac: Minor atherosclerotic origin but patent SMA: Widely patent Renals: Widely patent IMA: Calcified origin and small in caliber but appears patent. Difficult to fully assess. Inflow: Pelvic iliac vessels are patent. No aneurysm, dissection, occlusion or significant stenosis. Veins: Venous phase not performed. Review of the MIP images confirms the above findings. NON-VASCULAR Hepatobiliary: No focal liver abnormality is seen. No gallstones, gallbladder wall thickening, or biliary dilatation. Pancreas: Unremarkable. No pancreatic ductal dilatation or surrounding inflammatory changes. Spleen: Normal in size without focal abnormality. Adrenals/Urinary Tract: Adrenal glands are unremarkable. Kidneys are normal, without renal calculi, focal lesion, or hydronephrosis. Bladder is unremarkable. Stomach/Bowel: Negative for bowel obstruction, significant dilatation, ileus, or free air. Moderate stool throughout the colon. Exam of the bowel is limited without oral contrast for CTA protocol. Appendix not visualized. No acute inflammatory process, free fluid, fluid collection or abscess. Lymphatic: No adenopathy. Reproductive: Status post hysterectomy. No adnexal masses. Other: No abdominal wall hernia or abnormality. No abdominopelvic ascites. Musculoskeletal: Minor degenerative changes of the spine and facet joints. No acute osseous finding or fracture. Review of the MIP images confirms the above findings. IMPRESSION: Intact aorta. Minor atherosclerosis. No evidence of aneurysm or dissection. No acute vascular finding or abnormality. No acute intrathoracic finding. No acute intra-abdominal or pelvic finding Remote hysterectomy Electronically Signed   By: Judie PetitM.  Shick M.D.   On: 02/11/2016 14:24     IMPRESSION AND PLAN:   51 year old female with past medical history of hypertension who presents to the hospital due to chest pain.  1. Chest  pain-patient's chest pain is very atypical in nature. She has undergone a's chest x-ray and CT scan with contrast showing no evidence of pulmonary embolism or aortic dissection. -Given her strong family history patient is being observed in the hospital on telemetry. -We'll cycle her cardiac markers, we will get a two-dimensional echocardiogram. -Place on aspirin, nitroglycerin, morphine, oxygen.  2. Essential hypertension-patient's currently on no antihypertensives. Her blood pressure was slightly elevated on admission. -I will start her on low-dose metoprolol.  3. Hyperlipidemia-we'll place on oral potassium supplements. Repeat level in the morning. Next line-check magnesium level.    All the records are reviewed and case discussed with ED provider. Management plans discussed with the patient, family and they are in agreement.  CODE STATUS: Full  TOTAL TIME TAKING CARE OF THIS PATIENT: 45 minutes.    Houston SirenSAINANI,Avarae Zwart J M.D on 02/11/2016 at 3:34 PM  Between 7am to 6pm - Pager - [phone]  After 6pm go to [email or web address] - password [email or web address] [facility]  [facility]  Office  [phone]  CC: Primary care physician; No PCP Per Patient

## 2016-02-11 NOTE — ED Provider Notes (Signed)
[facility] Emergency Department Provider Note  ____________________________________________  Time seen: Approximately 12:45 PM  I have reviewed the triage vital signs and the nursing notes.   HISTORY  Chief Complaint Chest Pain   HPI Linda Giles is a 51 y.o. female with a history of hypertension family history of ischemic heart disease who presents for evaluation of chest pain. Patient reports that she was at work when she developed sudden onset of central squeezing chest pain nonradiating, associated with nausea, dizziness, shortness of breath, and right eye blurry vision. Patient also reports numbness of her right upper extremity associated with the pain. She reports that the pain is coming and going since its onset 30 minutes prior to arrival. Currently patient reports that the pain is mild in intensity. She denies ever having similar pain. She has family history of ischemic heart disease in her mother and grandmother. She is not a smoker. Patient never had a stress test or seen a cardiologist.  Past Medical History:  Diagnosis Date  . Hypertension     There are no active problems to display for this patient.   Past Surgical History:  Procedure Laterality Date  . ABDOMINAL HYSTERECTOMY      Prior to Admission medications   Medication Sig Start Date End Date Taking? Authorizing Provider  cyclobenzaprine (FLEXERIL) 5 MG tablet Take 1-2 tablets (5-10 mg total) by mouth 3 (three) times daily as needed for muscle spasms. 08/30/15   Evon Slack, PA-C    Allergies Compazine [prochlorperazine edisylate]  No family history on file.  Social History Social History  Substance Use Topics  . Smoking status: Never Smoker  . Smokeless tobacco: Never Used  . Alcohol use No    Review of Systems  Constitutional: Negative for fever. Eyes: Negative for visual changes. ENT: Negative for sore throat. Neck: No neck pain  Cardiovascular: + chest  pain. Respiratory: + shortness of breath. Gastrointestinal: Negative for abdominal pain, vomiting or diarrhea. Genitourinary: Negative for dysuria. Musculoskeletal: Negative for back pain. Skin: Negative for rash. Neurological: Negative for headaches. + RUE numbness Psych: No SI or HI  ____________________________________________   PHYSICAL EXAM:  VITAL SIGNS: ED Triage Vitals  Enc Vitals Group     BP 02/11/16 1230 (!) 169/84     Pulse Rate 02/11/16 1230 85     Resp 02/11/16 1230 20     Temp 02/11/16 1230 98.1 F (36.7 C)     Temp Source 02/11/16 1230 Oral     SpO2 02/11/16 1230 100 %     Weight 02/11/16 1230 247 lb (112 kg)     Height 02/11/16 1230 5\' 8"  (1.727 m)     Head Circumference --      Peak Flow --      Pain Score 02/11/16 1231 10     Pain Loc --      Pain Edu? --      Excl. in GC? --     Constitutional: Alert and oriented, mild distress due to pain.  HEENT:      Head: Normocephalic and atraumatic.         Eyes: Conjunctivae are normal. Sclera is non-icteric. EOMI. PERRL      Mouth/Throat: Mucous membranes are moist.       Neck: Supple with no signs of meningismus. Cardiovascular: Regular rate and rhythm. No murmurs, gallops, or rubs. 2+ symmetrical distal pulses are present in all extremities. No JVD. BP 184/77 RUE and 140/76 on LUE Respiratory:  Normal respiratory effort. Lungs are clear to auscultation bilaterally. No wheezes, crackles, or rhonchi.  Gastrointestinal: Soft, non tender, and non distended with positive bowel sounds. No rebound or guarding. Musculoskeletal: Nontender with normal range of motion in all extremities. No edema, cyanosis, or erythema of extremities. Neurologic: Noted to have a tremor in the RUE. Normal speech and language. Face is symmetric. Moving all extremities. Normal strength, normal sensation x4, no dysmetria or drift. No gross focal neurologic deficits are appreciated. Skin: Skin is warm, dry and intact. No rash  noted. Psychiatric: Mood and affect are normal. Speech and behavior are normal.  ____________________________________________   LABS (all labs ordered are listed, but only abnormal results are displayed)  Labs Reviewed  CBC WITH DIFFERENTIAL/PLATELET - Abnormal; Notable for the following:       Result Value   MCV 76.5 (*)    MCH 25.6 (*)    Neutro Abs 6.8 (*)    All other components within normal limits  BASIC METABOLIC PANEL - Abnormal; Notable for the following:    Potassium 3.2 (*)    Chloride 100 (*)    Glucose, Bld 110 (*)    Creatinine, Ser 1.08 (*)    GFR calc non Af Amer 60 (*)    All other components within normal limits  TROPONIN I  TROPONIN I   ____________________________________________  EKG  ED ECG REPORT I, Nita Sickle, the attending physician, personally viewed and interpreted this ECG.  12:20 - Normal sinus rhythm, rate of 81, normal intervals, normal axis, no ST elevations or depressions.  13:53 - Normal sinus rhythm, rate of 79, normal intervals, normal axis, no ST elevations or depressions. Unchanged from prior. ____________________________________________  RADIOLOGY  CXR: Negative  CT dissection:Intact aorta. Minor atherosclerosis. No evidence of aneurysm or dissection. No acute vascular finding or abnormality. No acute intrathoracic finding. No acute intra-abdominal or pelvic finding Remote hysterectomy  CT head: No intracranial abnormality ____________________________________________   PROCEDURES  Procedure(s) performed: None Procedures Critical Care performed:  None ____________________________________________   INITIAL IMPRESSION / ASSESSMENT AND PLAN / ED COURSE  51 y.o. female with a history of hypertension family history of ischemic heart disease who presents for evaluation of diffuse suden central chest pain associated with numbness of her right upper extremity and right eye blurry vision, patient noted to have a tremor  in the right upper extremity which is new for her. EKG with no evidence of ischemia. Differential diagnoses including dissection versus ACS. Patient was sent stat to CT scan to rule out dissection. I evaluated CT and I don't see any evidence of infection however formal reading is still pending. Patient reports that the pain keeps coming and going and right now she has none. Patient be given a full dose of aspirin once the CT is read. The first troponin is negative. We'll repeat EKG at this time.  Clinical Course as of Feb 11 1443  Sat Feb 11, 2016  1444 CT negative for dissection. Patient is chest pain-free. Second EKG with no ischemic changes. First troponin is negative. We'll admit patient for chest pain rule out. Patient was given a full dose of aspirin.  [CV]    Clinical Course User Index [CV] Nita Sickle, MD    Pertinent labs & imaging results that were available during my care of the patient were reviewed by me and considered in my medical decision making (see chart for details).    ____________________________________________   FINAL CLINICAL IMPRESSION(S) / ED DIAGNOSES  Final diagnoses:  Chest pain, unspecified type      NEW MEDICATIONS STARTED DURING THIS VISIT:  New Prescriptions   No medications on file     Note:  This document was prepared using Dragon voice recognition software and may include unintentional dictation errors.    Nita Sicklearolina Lovel Suazo, MD 02/11/16 [phone]

## 2016-02-11 NOTE — ED Notes (Signed)
Cont epigastric pain, states blurriness R eye is improved. Cont R arm tremor.

## 2016-02-12 ENCOUNTER — Observation Stay
Admit: 2016-02-12 | Discharge: 2016-02-12 | Disposition: A | Discharge status: Home / Self Care | Payer: Self-pay | Attending: Specialist | Admitting: Specialist

## 2016-02-12 LAB — BASIC METABOLIC PANEL
Anion gap: 8 (ref 5–15)
BUN: 10 mg/dL (ref 6–20)
CALCIUM: 9 mg/dL (ref 8.9–10.3)
CO2: 27 mmol/L (ref 22–32)
CREATININE: 0.75 mg/dL (ref 0.44–1.00)
Chloride: 105 mmol/L (ref 101–111)
Glucose, Bld: 105 mg/dL — ABNORMAL HIGH (ref 65–99)
Potassium: 3.6 mmol/L (ref 3.5–5.1)
SODIUM: 140 mmol/L (ref 135–145)

## 2016-02-12 LAB — CBC
HCT: 34.6 % — ABNORMAL LOW (ref 35.0–47.0)
Hemoglobin: 11.6 g/dL — ABNORMAL LOW (ref 12.0–16.0)
MCH: 25.7 pg — ABNORMAL LOW (ref 26.0–34.0)
MCHC: 33.6 g/dL (ref 32.0–36.0)
MCV: 76.5 fL — ABNORMAL LOW (ref 80.0–100.0)
PLATELETS: 370 10*3/uL (ref 150–440)
RBC: 4.52 MIL/uL (ref 3.80–5.20)
RDW: 14.6 % — ABNORMAL HIGH (ref 11.5–14.5)
WBC: 8.8 10*3/uL (ref 3.6–11.0)

## 2016-02-12 LAB — ECHOCARDIOGRAM COMPLETE
HEIGHTINCHES: 68 in
WEIGHTICAEL: 4032 [oz_av]

## 2016-02-12 LAB — TROPONIN I

## 2016-02-12 MED ORDER — ASPIRIN 81 MG PO TBEC: 81.0000 mg | DELAYED_RELEASE_TABLET | Freq: Every day | ORAL | Status: DC

## 2016-02-12 MED ORDER — METOPROLOL TARTRATE 25 MG PO TABS: 25.0000 mg | ORAL_TABLET | Freq: Two times a day (BID) | ORAL | 0 refills | Status: DC

## 2016-02-12 NOTE — Discharge Summary (Signed)
[facility] [place] [facility]  Linda Giles, [place]50 y[place]o., DOB 07/25/1965, MRN [id number]. Admission date: 02/11/2016 Discharge Date 02/12/2016 Primary MD No PCP Per Patient Admitting Physician Houston SirenVivek J Sainani, MD  Admission Diagnosis  Pain [R52] Chest pain, unspecified type [R07.9]  Discharge Diagnosis   Active Problems:   Chest pain atypical   Essential hypertension        Hospital Course  Linda HousekeeperKiwainia Giles  is a 51 y.o. female with a known history of Hypertension who presents to the hospital due to chest pain. Patient says she was at work when she suddenly developed substernal chest pain that radiated down her body associated with some tremors in the right arm and also a headache and some nausea and vomiting. Patient's workup including EKG was negative cardiac enzymes were negative. She was admitted for further evaluation her cardiac  enzymes remain negative. Patient was taking some Sudafed that could've contributed to the symptoms. She is currently not having any chest pain. I discussed the case with Dr. Lennette BihariKohn of cardiology who will see the patient tomorrow patient will likely need outpatient stress test.            Consults  None  Significant Tests:  See full reports for all details    Ct Head Wo Contrast  Result Date: 02/11/2016 CLINICAL DATA:  Right arm tremor EXAM: CT HEAD WITHOUT CONTRAST TECHNIQUE: Contiguous axial images were obtained from the base of the skull through the vertex without intravenous contrast. COMPARISON:  03/06/2007 FINDINGS: Brain: No acute intracranial abnormality. Specifically, no hemorrhage, hydrocephalus, mass lesion, acute infarction, or significant intracranial injury. Vascular: No hyperdense vessel or unexpected calcification. Skull: No acute calvarial abnormality. Sinuses/Orbits: Visualized paranasal sinuses and mastoids clear. Orbital soft tissues unremarkable. Other: None IMPRESSION: No intracranial abnormality.  Electronically Signed   By: Charlett NoseKevin  Dover M.D.   On: 02/11/2016 13:57   Dg Chest Port 1 View  Result Date: 02/11/2016 CLINICAL DATA:  Pt in via EMS with complaints of sudden onset of squeezing centralized chest pain without radiation. Pt reports associate SOB, dizziness, N/V. Pt also with new onset blurred vision and tremor in right arm. Nonsmoker. EXAM: PORTABLE CHEST 1 VIEW COMPARISON:  02/20/2011 FINDINGS: The heart size and mediastinal contours are within normal limits. Both lungs are clear. No pleural effusion or pneumothorax. The visualized skeletal structures are unremarkable. IMPRESSION: No active disease. Electronically Signed   By: Amie Portlandavid  Ormond M.D.   On: 02/11/2016 13:21   Ct Angio Chest/abd/pel For Dissection W And/or Wo Contrast  Result Date: 02/11/2016 CLINICAL DATA:  Chest pain, right arm tremor, severe onset, concern for dissection EXAM: CT ANGIOGRAPHY CHEST, ABDOMEN AND PELVIS TECHNIQUE: Multidetector CT imaging through the chest, abdomen and pelvis was performed using the standard protocol during bolus administration of intravenous contrast. Multiplanar reconstructed images and MIPs were obtained and reviewed to evaluate the vascular anatomy. CONTRAST:  100 cc Isovue 370 COMPARISON:  02/20/2011 FINDINGS: CTA CHEST FINDINGS Cardiovascular: Intact thoracic aorta. Negative for aneurysm or dissection. Initial noncontrast imaging demonstrates no hyperdense intramural hemorrhage or hematoma. No mediastinal hemorrhage. Normal heart size. No pericardial effusion. Two vessel arch anatomy. Mediastinum/Nodes: No adenopathy. Thyroid and trachea unremarkable. Small hiatal hernia noted at the GE junction. Lungs/Pleura: Lungs are clear. No pleural effusion or pneumothorax. Musculoskeletal: Minor lower thoracic spondylosis and endplate osteophytes. No acute osseous finding. Review of the MIP images confirms the above findings. CTA ABDOMEN AND PELVIS FINDINGS VASCULAR Aorta: Minor distal aortic  atherosclerosis. Negative for aneurysm,  dissection, or occlusive process. Celiac: Minor atherosclerotic origin but patent SMA: Widely patent Renals: Widely patent IMA: Calcified origin and small in caliber but appears patent. Difficult to fully assess. Inflow: Pelvic iliac vessels are patent. No aneurysm, dissection, occlusion or significant stenosis. Veins: Venous phase not performed. Review of the MIP images confirms the above findings. NON-VASCULAR Hepatobiliary: No focal liver abnormality is seen. No gallstones, gallbladder wall thickening, or biliary dilatation. Pancreas: Unremarkable. No pancreatic ductal dilatation or surrounding inflammatory changes. Spleen: Normal in size without focal abnormality. Adrenals/Urinary Tract: Adrenal glands are unremarkable. Kidneys are normal, without renal calculi, focal lesion, or hydronephrosis. Bladder is unremarkable. Stomach/Bowel: Negative for bowel obstruction, significant dilatation, ileus, or free air. Moderate stool throughout the colon. Exam of the bowel is limited without oral contrast for CTA protocol. Appendix not visualized. No acute inflammatory process, free fluid, fluid collection or abscess. Lymphatic: No adenopathy. Reproductive: Status post hysterectomy. No adnexal masses. Other: No abdominal wall hernia or abnormality. No abdominopelvic ascites. Musculoskeletal: Minor degenerative changes of the spine and facet joints. No acute osseous finding or fracture. Review of the MIP images confirms the above findings. IMPRESSION: Intact aorta. Minor atherosclerosis. No evidence of aneurysm or dissection. No acute vascular finding or abnormality. No acute intrathoracic finding. No acute intra-abdominal or pelvic finding Remote hysterectomy Electronically Signed   By: Judie Petit.  Shick M.D.   On: 02/11/2016 14:24       Today   Subjective:   Linda Giles  patient feels fine states that chest pain is resolved  Objective:   Blood pressure (!) 153/72, pulse 61,  temperature 98.2 F (36.8 C), temperature source Oral, resp. rate 16, height 5\' 8"  (1.727 m), weight 252 lb (114.3 kg), SpO2 97 %.  .  Intake/Output Summary (Last 24 hours) at 02/12/16 1250 Last data filed at 02/12/16 0900  Gross per 24 hour  Intake              240 ml  Output              500 ml  Net             -260 ml    Exam VITAL SIGNS: Blood pressure (!) 153/72, pulse 61, temperature 98.2 F (36.8 C), temperature source Oral, resp. rate 16, height 5\' 8"  (1.727 m), weight 252 lb (114.3 kg), SpO2 97 %.  GENERAL:  51 y.o.-year-old patient lying in the bed with no acute distress.  EYES: Pupils equal, round, reactive to light and accommodation. No scleral icterus. Extraocular muscles intact.  HEENT: Head atraumatic, normocephalic. Oropharynx and nasopharynx clear.  NECK:  Supple, no jugular venous distention. No thyroid enlargement, no tenderness.  LUNGS: Normal breath sounds bilaterally, no wheezing, rales,rhonchi or crepitation. No use of accessory muscles of respiration.  CARDIOVASCULAR: S1, S2 normal. No murmurs, rubs, or gallops.  ABDOMEN: Soft, nontender, nondistended. Bowel sounds present. No organomegaly or mass.  EXTREMITIES: No pedal edema, cyanosis, or clubbing.  NEUROLOGIC: Cranial nerves II through XII are intact. Muscle strength 5/5 in all extremities. Sensation intact. Gait not checked.  PSYCHIATRIC: The patient is alert and oriented x 3.  SKIN: No obvious rash, lesion, or ulcer.   Data Review     CBC w Diff: Lab Results  Component Value Date   WBC 8.8 02/12/2016   HGB 11.6 (L) 02/12/2016   HGB 11.6 (L) 02/23/2011   HCT 34.6 (L) 02/12/2016   HCT 36.0 02/23/2011   PLT 370 02/12/2016   PLT 330 02/23/2011  LYMPHOPCT 21 02/11/2016   LYMPHOPCT 31.2 02/23/2011   MONOPCT 6 02/11/2016   MONOPCT 8.4 02/23/2011   EOSPCT 1 02/11/2016   EOSPCT 1.3 02/23/2011   BASOPCT 1 02/11/2016   BASOPCT 0.5 02/23/2011   CMP: Lab Results  Component Value Date   NA 140  02/12/2016   NA 145 02/23/2011   K 3.6 02/12/2016   K 4.0 02/23/2011   CL 105 02/12/2016   CL 108 (H) 02/23/2011   CO2 27 02/12/2016   CO2 27 02/23/2011   BUN 10 02/12/2016   BUN 4 (L) 02/23/2011   CREATININE 0.75 02/12/2016   CREATININE 0.65 02/23/2011   PROT 7.8 02/20/2011   ALBUMIN 3.6 02/20/2011   BILITOT 0.2 02/20/2011   ALKPHOS 63 02/20/2011   AST 14 (L) 02/20/2011   ALT 16 02/20/2011  .  Micro Results No results found for this or any previous visit (from the past 240 hour(s)).      Code Status Orders        Start     Ordered   02/11/16 1752  Full code  Continuous     02/11/16 1751    Code Status History    Date Active Date Inactive Code Status Order ID Comments User Context   This patient has a current code status but no historical code status.          Follow-up Information    KHAN,SHAUKAT A, MD Follow up on 02/13/2016.   Specialty:  Cardiology Why:  tomm at 9 am , dont eat anything prior to going there Contact information: [address] [place] [address] [phone]           Discharge Medications   Allergies as of 02/12/2016      Reactions   Compazine [prochlorperazine Edisylate] Swelling      Medication List    STOP taking these medications   pseudoephedrine 30 MG tablet Commonly known as:  SUDAFED     TAKE these medications   aspirin 81 MG EC tablet Take 1 tablet (81 mg total) by mouth daily.   cyclobenzaprine 5 MG tablet Commonly known as:  FLEXERIL Take 1-2 tablets (5-10 mg total) by mouth 3 (three) times daily as needed for muscle spasms.   metoprolol tartrate 25 MG tablet Commonly known as:  LOPRESSOR Take 1 tablet (25 mg total) by mouth 2 (two) times daily.   vitamin B-12 1000 MCG tablet Commonly known as:  CYANOCOBALAMIN Take 1,000 mcg by mouth daily.          Total Time in preparing paper work, data evaluation and todays exam - 35 minutes  Auburn Bilberry M.D on 02/12/2016 at 12:50 PM  [facility]   Office  [phone]

## 2016-02-12 NOTE — Discharge Instructions (Signed)
[facility] - [place] [facility] ° °DIET:  °Cardiac diet ° °DISCHARGE CONDITION:  °Stable ° °ACTIVITY:  °Activity as tolerated ° °OXYGEN:  °Home Oxygen: No. °  °Oxygen Delivery: room air ° °DISCHARGE LOCATION:  °home  ° ° °ADDITIONAL DISCHARGE INSTRUCTION: ° ° °If you experience worsening of your admission symptoms, develop shortness of breath, life threatening emergency, suicidal or homicidal thoughts you must seek medical attention immediately by calling 911 or calling your MD immediately  if symptoms less severe. ° °You Must read complete instructions/literature along with all the possible adverse reactions/side effects for all the Medicines you take and that have been prescribed to you. Take any new Medicines after you have completely understood and accpet all the possible adverse reactions/side effects.  ° °Please note ° °You were cared for by a hospitalist during your hospital stay. If you have any questions about your discharge medications or the care you received while you were in the hospital after you are discharged, you can call the unit and asked to speak with the hospitalist on call if the hospitalist that took care of you is not available. Once you are discharged, your primary care physician will handle any further medical issues. Please note that NO REFILLS for any discharge medications will be authorized once you are discharged, as it is imperative that you return to your primary care physician (or establish a relationship with a primary care physician if you do not have one) for your aftercare needs so that they can reassess your need for medications and monitor your lab values. ° ° °

## 2016-02-12 NOTE — Progress Notes (Signed)
*  PRELIMINARY RESULTS* Echocardiogram 2D Echocardiogram has been performed.  Garrel Ridgelikeshia S Lauriana Denes 02/12/2016, 10:45 AM

## 2016-02-15 ENCOUNTER — Emergency Department
Admission: EM | Admit: 2016-02-15 | Discharge: 2016-02-15 | Disposition: A | Discharge status: Home / Self Care | Payer: Self-pay | Attending: Emergency Medicine | Admitting: Emergency Medicine

## 2016-02-15 ENCOUNTER — Encounter: Payer: Self-pay | Admitting: *Deleted

## 2016-02-15 DIAGNOSIS — Z7982 Long term (current) use of aspirin: Secondary | ICD-10-CM | POA: Insufficient documentation

## 2016-02-15 DIAGNOSIS — I10 Essential (primary) hypertension: Secondary | ICD-10-CM | POA: Insufficient documentation

## 2016-02-15 DIAGNOSIS — K0381 Cracked tooth: Secondary | ICD-10-CM | POA: Insufficient documentation

## 2016-02-15 DIAGNOSIS — N3000 Acute cystitis without hematuria: Secondary | ICD-10-CM | POA: Insufficient documentation

## 2016-02-15 DIAGNOSIS — Z79899 Other long term (current) drug therapy: Secondary | ICD-10-CM | POA: Insufficient documentation

## 2016-02-15 DIAGNOSIS — K047 Periapical abscess without sinus: Secondary | ICD-10-CM | POA: Insufficient documentation

## 2016-02-15 LAB — URINALYSIS, COMPLETE (UACMP) WITH MICROSCOPIC
BACTERIA UA: NONE SEEN
Bilirubin Urine: NEGATIVE
Glucose, UA: NEGATIVE mg/dL
Ketones, ur: NEGATIVE mg/dL
NITRITE: NEGATIVE
Protein, ur: 30 mg/dL — AB
SPECIFIC GRAVITY, URINE: 1.028 (ref 1.005–1.030)
pH: 5 (ref 5.0–8.0)

## 2016-02-15 MED ORDER — SULFAMETHOXAZOLE-TRIMETHOPRIM 800-160 MG PO TABS: 1.0000 | ORAL_TABLET | Freq: Two times a day (BID) | ORAL | 0 refills | Status: AC

## 2016-02-15 NOTE — ED Notes (Signed)
See triage note  States she felt some swelling to right side of face   Low grade fever on arrival

## 2016-02-15 NOTE — ED Triage Notes (Signed)
States facial swelling on the right side of her face this AM, states it then went to the left side of her face, pt awake and alert, denies any new medications, pt in no acute distress

## 2016-02-15 NOTE — ED Provider Notes (Signed)
[facility] Emergency Department Provider Note  ____________________________________________  Time seen: Approximately 6:18 PM  I have reviewed the triage vital signs and the nursing notes.   HISTORY  Chief Complaint Facial Swelling    HPI Linda LatheKiwainia L Giles is a 51 y.o. female that presents to the emergency department with left-sided facial swelling for 1 day. Patient states that she started metoprolol last week. Patient also states that she had a sore on her gum that seems to be healing but is still very tender to touch. Patient had a tooth pulled on the left-hand side one month ago. Patient states that tooth next to the pulled tooth broke off recently. Patient also has had some urgency and frequency of urination. Patient denies fever, difficulty swallowing, mild discharge, cough, shortness of breath, chest pain, nausea, vomiting, abdominal pain, back pain, dysuria.   Past Medical History:  Diagnosis Date  . Hypertension     Patient Active Problem List   Diagnosis Date Noted  . Chest pain 02/11/2016    Past Surgical History:  Procedure Laterality Date  . ABDOMINAL HYSTERECTOMY      Prior to Admission medications   Medication Sig Start Date End Date Taking? Authorizing Provider  aspirin EC 81 MG EC tablet Take 1 tablet (81 mg total) by mouth daily. 02/12/16   Auburn BilberryShreyang Patel, MD  metoprolol tartrate (LOPRESSOR) 25 MG tablet Take 1 tablet (25 mg total) by mouth 2 (two) times daily. 02/12/16   Auburn BilberryShreyang Patel, MD  sulfamethoxazole-trimethoprim (BACTRIM DS,SEPTRA DS) 800-160 MG tablet Take 1 tablet by mouth 2 (two) times daily. 02/15/16 02/25/16  Enid DerryAshley Salina Stanfield, PA-C  vitamin B-12 (CYANOCOBALAMIN) 1000 MCG tablet Take 1,000 mcg by mouth daily.    Historical Provider, MD    Allergies Compazine [prochlorperazine edisylate]  Family History  Problem Relation Age of Onset  . Heart disease Mother   . Hypertension Mother   . CVA Mother   . Colon cancer Mother    . Cervical cancer Mother     Social History Social History  Substance Use Topics  . Smoking status: Never Smoker  . Smokeless tobacco: Never Used  . Alcohol use No     Review of Systems  Constitutional: No fever/chills ENT: No upper respiratory complaints. Cardiovascular: No chest pain. Respiratory: No cough. No SOB. Gastrointestinal: No abdominal pain.  No nausea, no vomiting.  Genitourinary: Negative for dysuria. Musculoskeletal: Negative for musculoskeletal pain. Skin: Negative for rash, abrasions, lacerations, ecchymosis. Neurological: Negative for headaches, numbness or tingling   ____________________________________________   PHYSICAL EXAM:  VITAL SIGNS: ED Triage Vitals  Enc Vitals Group     BP 02/15/16 1133 133/63     Pulse Rate 02/15/16 1133 (!) 102     Resp 02/15/16 1133 18     Temp 02/15/16 1133 99.4 F (37.4 C)     Temp Source 02/15/16 1133 Oral     SpO2 02/15/16 1133 98 %     Weight 02/15/16 1134 252 lb (114.3 kg)     Height 02/15/16 1134 5\' 8"  (1.727 m)     Head Circumference --      Peak Flow --      Pain Score 02/15/16 1352 6     Pain Loc --      Pain Edu? --      Excl. in GC? --      Constitutional: Alert and oriented. Well appearing and in no acute distress. Eyes: Conjunctivae are normal. PERRL. EOMI. Head: Atraumatic. ENT:  Ears:      Nose: No congestion/rhinnorhea.      Mouth/Throat: Mucous membranes are moist.  Multiple teeth with cavities. Tooth #11 has been pulled next tooth is fractured. Patient has tenderness to palpation around fractured tooth. No discharge. No areas of fluctuance on buccal or mucosal surfaces. No obvious swelling on exam. No TMJ pain.  Neck: No stridor.   Cardiovascular: Normal rate, regular rhythm.  Good peripheral circulation. Respiratory: Normal respiratory effort without tachypnea or retractions. Lungs CTAB. Good air entry to the bases with no decreased or absent breath sounds. Gastrointestinal: Bowel  sounds 4 quadrants. Soft and nontender to palpation. No guarding or rigidity. No palpable masses. No distention. No CVA tenderness. Musculoskeletal: Full range of motion to all extremities. No gross deformities appreciated. Neurologic:  Normal speech and language. No gross focal neurologic deficits are appreciated.  Skin:  Skin is warm, dry and intact. No rash noted. Psychiatric: Mood and affect are normal. Speech and behavior are normal. Patient exhibits appropriate insight and judgement.   ____________________________________________   LABS (all labs ordered are listed, but only abnormal results are displayed)  Labs Reviewed  URINALYSIS, COMPLETE (UACMP) WITH MICROSCOPIC - Abnormal; Notable for the following:       Result Value   Color, Urine AMBER (*)    APPearance HAZY (*)    Hgb urine dipstick MODERATE (*)    Protein, ur 30 (*)    Leukocytes, UA MODERATE (*)    Squamous Epithelial / LPF 6-30 (*)    All other components within normal limits  URINE CULTURE   ____________________________________________  EKG   ____________________________________________  RADIOLOGY   No results found.  ____________________________________________    PROCEDURES  Procedure(s) performed:    Procedures    Medications - No data to display   ____________________________________________   INITIAL IMPRESSION / ASSESSMENT AND PLAN / ED COURSE  Pertinent labs & imaging results that were available during my care of the patient were reviewed by me and considered in my medical decision making (see chart for details).  Review of the [place] CSRS was performed in accordance of the [facility] prior to dispensing any controlled drugs.     Patient's diagnosis is consistent with fractured tooth and urinary tract infection. Vital signs and exam are reassuring.  Patient will be discharged home with prescriptions for Bactrim. Patient is to follow up with dentist and PCP as directed. Patient is given  ED precautions to return to the ED for any worsening or new symptoms.     ____________________________________________  FINAL CLINICAL IMPRESSION(S) / ED DIAGNOSES  Final diagnoses:  Dental infection  Acute cystitis without hematuria      NEW MEDICATIONS STARTED DURING THIS VISIT:  Discharge Medication List as of 02/15/2016  1:46 PM    START taking these medications   Details  sulfamethoxazole-trimethoprim (BACTRIM DS,SEPTRA DS) 800-160 MG tablet Take 1 tablet by mouth 2 (two) times daily., Starting Wed 02/15/2016, Until Sat 02/25/2016, Print            This chart was dictated using voice recognition software/Dragon. Despite best efforts to proofread, errors can occur which can change the meaning. Any change was purely unintentional.    Enid Derry, PA-C 02/15/16 1823    Emily Filbert, MD 02/16/16 [phone]

## 2016-02-16 LAB — URINE CULTURE

## 2017-03-16 ENCOUNTER — Emergency Department: Payer: BLUE CROSS/BLUE SHIELD

## 2017-03-16 ENCOUNTER — Encounter: Payer: Self-pay | Admitting: Emergency Medicine

## 2017-03-16 ENCOUNTER — Emergency Department
Admission: EM | Admit: 2017-03-16 | Discharge: 2017-03-16 | Disposition: A | Discharge status: Home / Self Care | Payer: BLUE CROSS/BLUE SHIELD | Attending: Emergency Medicine | Admitting: Emergency Medicine

## 2017-03-16 ENCOUNTER — Other Ambulatory Visit: Payer: Self-pay

## 2017-03-16 DIAGNOSIS — I10 Essential (primary) hypertension: Secondary | ICD-10-CM | POA: Insufficient documentation

## 2017-03-16 DIAGNOSIS — Z79899 Other long term (current) drug therapy: Secondary | ICD-10-CM | POA: Diagnosis not present

## 2017-03-16 DIAGNOSIS — Z7982 Long term (current) use of aspirin: Secondary | ICD-10-CM | POA: Insufficient documentation

## 2017-03-16 DIAGNOSIS — M5134 Other intervertebral disc degeneration, thoracic region: Secondary | ICD-10-CM | POA: Insufficient documentation

## 2017-03-16 DIAGNOSIS — M549 Dorsalgia, unspecified: Secondary | ICD-10-CM | POA: Diagnosis present

## 2017-03-16 MED ORDER — CYCLOBENZAPRINE HCL 10 MG PO TABS: 10.0000 mg | ORAL_TABLET | Freq: Three times a day (TID) | ORAL | 0 refills | Status: DC | PRN

## 2017-03-16 MED ORDER — MELOXICAM 15 MG PO TABS: 15.0000 mg | ORAL_TABLET | Freq: Every day | ORAL | 0 refills | Status: DC

## 2017-03-16 NOTE — ED Triage Notes (Signed)
Mid back pain that increases with movement or sitting x 1 week. Denies injury at onset.

## 2017-03-16 NOTE — ED Provider Notes (Signed)
[facility] Emergency Department Provider Note ____________________________________________  Time seen: Approximately 10:21 AM  I have reviewed the triage vital signs and the nursing notes.   HISTORY  Chief Complaint Back Pain    HPI Linda Giles is a 52 y.o. female who presents to the emergency department for treatment and evaluation of mid back pain.  Pain started about 10 days ago. She lifted a heavy tea urn last night and pain got worse. No relief with tylenol or ibuprofen.   Past Medical History:  Diagnosis Date  . Hypertension     Patient Active Problem List   Diagnosis Date Noted  . Chest pain 02/11/2016    Past Surgical History:  Procedure Laterality Date  . ABDOMINAL HYSTERECTOMY      Prior to Admission medications   Medication Sig Start Date End Date Taking? Authorizing Provider  aspirin EC 81 MG EC tablet Take 1 tablet (81 mg total) by mouth daily. 02/12/16   Auburn Bilberry, MD  cyclobenzaprine (FLEXERIL) 10 MG tablet Take 1 tablet (10 mg total) by mouth 3 (three) times daily as needed for muscle spasms. 03/16/17   Shirleyann Montero, Rulon Eisenmenger B, FNP  meloxicam (MOBIC) 15 MG tablet Take 1 tablet (15 mg total) by mouth daily. 03/16/17   Janthony Holleman, Rulon Eisenmenger B, FNP  metoprolol tartrate (LOPRESSOR) 25 MG tablet Take 1 tablet (25 mg total) by mouth 2 (two) times daily. 02/12/16   Auburn Bilberry, MD  vitamin B-12 (CYANOCOBALAMIN) 1000 MCG tablet Take 1,000 mcg by mouth daily.    [provider]    Allergies Compazine [prochlorperazine edisylate]  Family History  Problem Relation Age of Onset  . Heart disease Mother   . Hypertension Mother   . CVA Mother   . Colon cancer Mother   . Cervical cancer Mother     Social History Social History   Tobacco Use  . Smoking status: Never Smoker  . Smokeless tobacco: Never Used  Substance Use Topics  . Alcohol use: No  . Drug use: No    Review of Systems Constitutional: Negative for  fever. Cardiovascular: Negative for chest pain. Respiratory: Negative for cough. Musculoskeletal: Positive for mid back pain. Skin: Negative for rash, lesion, or wound.   ____________________________________________   PHYSICAL EXAM:  VITAL SIGNS: ED Triage Vitals  Enc Vitals Group     BP 03/16/17 0754 (!) 192/91     Pulse Rate 03/16/17 0754 70     Resp 03/16/17 0754 18     Temp 03/16/17 0754 98.2 F (36.8 C)     Temp Source 03/16/17 0754 Oral     SpO2 03/16/17 0754 99 %     Weight 03/16/17 0755 245 lb (111.1 kg)     Height 03/16/17 0755 5\' 7"  (1.702 m)     Head Circumference --      Peak Flow --      Pain Score 03/16/17 0755 7     Pain Loc --      Pain Edu? --      Excl. in GC? --     Constitutional: Alert and oriented. Well appearing and in no acute distress. Eyes: Conjunctivae are clear without discharge or drainage Head: Atraumatic Neck: Supple. No midline tenderness. Respiratory: Respirations are even and unlabored. Breath sounds clear to auscultation. Musculoskeletal: Diffuse tenderness over the mid thoracic area. Neurologic: Awake, alert, and oriented x 4.  Skin: Warm, dry, intact.  Psychiatric: Affect and behavior are appropriated.  ____________________________________________   LABS (all labs ordered  are listed, but only abnormal results are displayed)  Labs Reviewed - No data to display ____________________________________________  RADIOLOGY  Thoracic spine:  Multilevel degenerative disc disease with small anterior osteophytes. No fractures. ____________________________________________   PROCEDURES  Procedures  ____________________________________________   INITIAL IMPRESSION / ASSESSMENT AND PLAN / ED COURSE  Linda Giles is a 52 y.o. female who presents to the emergency department for evaluation treatment of thoracic pain.  No specific injury.  Images of the thoracic spine indicated degenerative disc disease, but no acute bony  abnormality.  Patient will be prescribed meloxicam and tramadol and encouraged to follow-up with her primary care provider for symptoms of concern if not improving over the next week or so.  She was instructed to return to the emergency department for symptoms of change or worsen if she is unable to schedule an appointment.  Medications - No data to display  Pertinent labs & imaging results that were available during my care of the patient were reviewed by me and considered in my medical decision making (see chart for details).  _________________________________________   FINAL CLINICAL IMPRESSION(S) / ED DIAGNOSES  Final diagnoses:  Degenerative disc disease, thoracic  Hypertension, unspecified type    ED Discharge Orders        Ordered    meloxicam (MOBIC) 15 MG tablet  Daily     03/16/17 0925    cyclobenzaprine (FLEXERIL) 10 MG tablet  3 times daily PRN     03/16/17 0925       If controlled substance prescribed during this visit, 12 month history viewed on the [place] prior to issuing an initial prescription for Schedule II or III opiod.    Chinita Pesterriplett, Fayola Meckes B, FNP 03/16/17 1037    Myrna BlazerSchaevitz, David Matthew, MD 03/16/17 1524

## 2017-03-16 NOTE — ED Notes (Signed)
NAD noted at time of D/C. Pt denies questions or concerns. Pt ambulatory to the lobby at this time.  

## 2017-07-19 ENCOUNTER — Encounter: Payer: Self-pay | Admitting: Emergency Medicine

## 2017-07-19 ENCOUNTER — Other Ambulatory Visit: Payer: Self-pay

## 2017-07-19 ENCOUNTER — Emergency Department
Admission: EM | Admit: 2017-07-19 | Discharge: 2017-07-19 | Disposition: A | Discharge status: Home / Self Care | Payer: BLUE CROSS/BLUE SHIELD | Attending: Emergency Medicine | Admitting: Emergency Medicine

## 2017-07-19 DIAGNOSIS — Y929 Unspecified place or not applicable: Secondary | ICD-10-CM | POA: Insufficient documentation

## 2017-07-19 DIAGNOSIS — Y9389 Activity, other specified: Secondary | ICD-10-CM | POA: Insufficient documentation

## 2017-07-19 DIAGNOSIS — G8929 Other chronic pain: Secondary | ICD-10-CM | POA: Diagnosis not present

## 2017-07-19 DIAGNOSIS — S29012A Strain of muscle and tendon of back wall of thorax, initial encounter: Secondary | ICD-10-CM | POA: Diagnosis not present

## 2017-07-19 DIAGNOSIS — M549 Dorsalgia, unspecified: Secondary | ICD-10-CM | POA: Diagnosis not present

## 2017-07-19 DIAGNOSIS — X501XXA Overexertion from prolonged static or awkward postures, initial encounter: Secondary | ICD-10-CM | POA: Diagnosis not present

## 2017-07-19 DIAGNOSIS — Z7982 Long term (current) use of aspirin: Secondary | ICD-10-CM | POA: Insufficient documentation

## 2017-07-19 DIAGNOSIS — Y998 Other external cause status: Secondary | ICD-10-CM | POA: Diagnosis not present

## 2017-07-19 DIAGNOSIS — I10 Essential (primary) hypertension: Secondary | ICD-10-CM | POA: Diagnosis not present

## 2017-07-19 DIAGNOSIS — Z79899 Other long term (current) drug therapy: Secondary | ICD-10-CM | POA: Insufficient documentation

## 2017-07-19 DIAGNOSIS — S299XXA Unspecified injury of thorax, initial encounter: Secondary | ICD-10-CM | POA: Diagnosis present

## 2017-07-19 HISTORY — DX: Dorsalgia, unspecified: M54.9

## 2017-07-19 HISTORY — DX: Other chronic pain: G89.29

## 2017-07-19 MED ORDER — ORPHENADRINE CITRATE 30 MG/ML IJ SOLN
60.0000 mg | Freq: Once | INTRAMUSCULAR | Status: AC
  Administered 2017-07-19: 60 mg via INTRAMUSCULAR
  Filled 2017-07-19: qty 2

## 2017-07-19 MED ORDER — KETOROLAC TROMETHAMINE 30 MG/ML IJ SOLN
30.0000 mg | Freq: Once | INTRAMUSCULAR | Status: AC
  Administered 2017-07-19: 30 mg via INTRAMUSCULAR
  Filled 2017-07-19: qty 1

## 2017-07-19 MED ORDER — MELOXICAM 15 MG PO TABS: 15.0000 mg | ORAL_TABLET | Freq: Every day | ORAL | 0 refills | Status: DC

## 2017-07-19 MED ORDER — METHOCARBAMOL 500 MG PO TABS: 500.0000 mg | ORAL_TABLET | Freq: Four times a day (QID) | ORAL | 0 refills | Status: DC

## 2017-07-19 NOTE — ED Notes (Signed)
Pt presents with recurring back pain x 3 days. Pt states she has intermittent back pain and does not have a pcp to follow her for it. Pt reports pain to mid to lower back, bilateral; mostly along the spinal column. Sitting can be difficult for her.

## 2017-07-19 NOTE — ED Provider Notes (Signed)
[facility] Emergency Department Provider Note  ____________________________________________  Time seen: Approximately 4:36 PM  I have reviewed the triage vital signs and the nursing notes.   HISTORY  Chief Complaint Back Pain    HPI Linda Giles is a 52 y.o. female who presents the emergency department complaining of mid back pain.  Patient reports that she has a long-standing history of chronic back pain, was on 9 frequent anti-inflammatories and muscle relaxers for her symptoms.  Patient reports that she no longer has a primary care doctor and has not had any of those medications in a significant period of time.  Patient reports that she was twisting, felt a pop/pull sensation in her mid back 2 days ago.  Patient has had symptoms consistent with muscle spasms in her mid back.  She denies any lumbar or cervical pain.  She denies any radicular symptoms in the upper or lower extremities.  No bowel or bladder dysfunction, saddle anesthesia, paresthesias.  No urinary symptoms of dysuria, polyuria, hematuria.  No GI complaints.  No medications for this complaint prior to arrival.  No complaints.    Past Medical History:  Diagnosis Date  . Chronic back pain   . Hypertension     Patient Active Problem List   Diagnosis Date Noted  . Chest pain 02/11/2016    Past Surgical History:  Procedure Laterality Date  . ABDOMINAL HYSTERECTOMY      Prior to Admission medications   Medication Sig Start Date End Date Taking? Authorizing Provider  aspirin EC 81 MG EC tablet Take 1 tablet (81 mg total) by mouth daily. 02/12/16   Auburn Bilberry, MD  cyclobenzaprine (FLEXERIL) 10 MG tablet Take 1 tablet (10 mg total) by mouth 3 (three) times daily as needed for muscle spasms. 03/16/17   Triplett, Rulon Eisenmenger B, FNP  meloxicam (MOBIC) 15 MG tablet Take 1 tablet (15 mg total) by mouth daily. 07/19/17   Angelo Prindle, Delorise Royals, PA-C  methocarbamol (ROBAXIN) 500 MG tablet Take 1 tablet (500  mg total) by mouth 4 (four) times daily. 07/19/17   Alle Difabio, Delorise Royals, PA-C  metoprolol tartrate (LOPRESSOR) 25 MG tablet Take 1 tablet (25 mg total) by mouth 2 (two) times daily. 02/12/16   Auburn Bilberry, MD  vitamin B-12 (CYANOCOBALAMIN) 1000 MCG tablet Take 1,000 mcg by mouth daily.    [provider]    Allergies Compazine [prochlorperazine edisylate]  Family History  Problem Relation Age of Onset  . Heart disease Mother   . Hypertension Mother   . CVA Mother   . Colon cancer Mother   . Cervical cancer Mother     Social History Social History   Tobacco Use  . Smoking status: Never Smoker  . Smokeless tobacco: Never Used  Substance Use Topics  . Alcohol use: No  . Drug use: No     Review of Systems  Constitutional: No fever/chills Eyes: No visual changes.  Cardiovascular: no chest pain. Respiratory: no cough. No SOB. Gastrointestinal: No abdominal pain.  No nausea, no vomiting.  Genitourinary: Negative for dysuria. No hematuria Musculoskeletal: Positive for mid back pain Skin: Negative for rash, abrasions, lacerations, ecchymosis. Neurological: Negative for headaches, focal weakness or numbness. 10-point ROS otherwise negative.  ____________________________________________   PHYSICAL EXAM:  VITAL SIGNS: ED Triage Vitals  Enc Vitals Group     BP 07/19/17 1503 (!) 145/85     Pulse Rate 07/19/17 1503 68     Resp --      Temp 07/19/17  1503 98.3 F (36.8 C)     Temp Source 07/19/17 1503 Oral     SpO2 07/19/17 1503 97 %     Weight 07/19/17 1504 260 lb (117.9 kg)     Height 07/19/17 1504 5\' 8"  (1.727 m)     Head Circumference --      Peak Flow --      Pain Score 07/19/17 1505 8     Pain Loc --      Pain Edu? --      Excl. in GC? --      Constitutional: Alert and oriented. Well appearing and in no acute distress. Eyes: Conjunctivae are normal. PERRL. EOMI. Head: Atraumatic. Neck: No stridor.  No cervical spine tenderness to palpation.   Cardiovascular: Normal rate, regular rhythm. Normal S1 and S2.  Good peripheral circulation. Respiratory: Normal respiratory effort without tachypnea or retractions. Lungs CTAB. Good air entry to the bases with no decreased or absent breath sounds. Gastrointestinal: Bowel sounds 4 quadrants. Soft and nontender to palpation. No guarding or rigidity. No palpable masses. No distention. No CVA tenderness. Musculoskeletal: Full range of motion to all extremities. No gross deformities appreciated.  No visible deformities to spine upon inspection.  Full range of motion.  Patient is diffusely tender to palpation throughout the thoracic spine, with palpable spasms in the left upper in the right lower paraspinal muscle groups in the thoracic region.  No palpable abnormality over midline.  No step-off.  No other tenderness to palpation over the spine Neurologic:  Normal speech and language. No gross focal neurologic deficits are appreciated.  Skin:  Skin is warm, dry and intact. No rash noted. Psychiatric: Mood and affect are normal. Speech and behavior are normal. Patient exhibits appropriate insight and judgement.   ____________________________________________   LABS (all labs ordered are listed, but only abnormal results are displayed)  Labs Reviewed - No data to display ____________________________________________  EKG   ____________________________________________  RADIOLOGY   No results found.  ____________________________________________    PROCEDURES  Procedure(s) performed:    Procedures    Medications  ketorolac (TORADOL) 30 MG/ML injection 30 mg (has no administration in time range)  orphenadrine (NORFLEX) injection 60 mg (has no administration in time range)     ____________________________________________   INITIAL IMPRESSION / ASSESSMENT AND PLAN / ED COURSE  Pertinent labs & imaging results that were available during my care of the patient were reviewed by me  and considered in my medical decision making (see chart for details).  Review of the [place] CSRS was performed in accordance of the [facility] prior to dispensing any controlled drugs.      Patient's diagnosis is consistent with thoracic paraspinal muscle spasms.  Patient presents the emergency department with sensation of muscle spasms in her mid back.  Patient has a history of chronic back problems and used to take anti-inflammatories and muscle relaxers on a daily basis.  She no longer takes same.  No concerning symptoms or findings on physical exam warranting further investigation of labs or imaging.  Patient is given Toradol and Norflex injections in the emergency department.. Patient will be discharged home with prescriptions for meloxicam and Robaxin. Patient is to follow up with primary care as needed or otherwise directed. Patient is given ED precautions to return to the ED for any worsening or new symptoms.     ____________________________________________  FINAL CLINICAL IMPRESSION(S) / ED DIAGNOSES  Final diagnoses:  Strain of thoracic paraspinal muscles excluding T1 and T2 levels, initial  encounter      NEW MEDICATIONS STARTED DURING THIS VISIT:  ED Discharge Orders        Ordered    meloxicam (MOBIC) 15 MG tablet  Daily     07/19/17 1657    methocarbamol (ROBAXIN) 500 MG tablet  4 times daily     07/19/17 1657          This chart was dictated using voice recognition software/Dragon. Despite best efforts to proofread, errors can occur which can change the meaning. Any change was purely unintentional.    Racheal Patches, PA-C 07/19/17 1658    Emily Filbert, MD 07/19/17 1745

## 2017-07-19 NOTE — ED Notes (Signed)
Pt discharged home after verbalizing understanding of discharge instructions; nad noted. 

## 2017-07-19 NOTE — ED Triage Notes (Signed)
Pt comes into the ED via POV c/o lower and mid back pain.  Patient has chronic pain but she said that she turned funny yesterday at work and now she is in worse pain.  Patient ambulatory to triage at this time but she states it feels as thought she is having muscle spasms.

## 2017-08-08 ENCOUNTER — Emergency Department
Admission: EM | Admit: 2017-08-08 | Discharge: 2017-08-08 | Disposition: A | Discharge status: Home / Self Care | Payer: BLUE CROSS/BLUE SHIELD | Attending: Emergency Medicine | Admitting: Emergency Medicine

## 2017-08-08 ENCOUNTER — Other Ambulatory Visit: Payer: Self-pay

## 2017-08-08 DIAGNOSIS — M546 Pain in thoracic spine: Secondary | ICD-10-CM | POA: Diagnosis present

## 2017-08-08 DIAGNOSIS — G8929 Other chronic pain: Secondary | ICD-10-CM | POA: Insufficient documentation

## 2017-08-08 DIAGNOSIS — I10 Essential (primary) hypertension: Secondary | ICD-10-CM | POA: Insufficient documentation

## 2017-08-08 DIAGNOSIS — Z7982 Long term (current) use of aspirin: Secondary | ICD-10-CM | POA: Insufficient documentation

## 2017-08-08 DIAGNOSIS — M542 Cervicalgia: Secondary | ICD-10-CM | POA: Diagnosis not present

## 2017-08-08 DIAGNOSIS — Z79899 Other long term (current) drug therapy: Secondary | ICD-10-CM | POA: Insufficient documentation

## 2017-08-08 DIAGNOSIS — M5135 Other intervertebral disc degeneration, thoracolumbar region: Secondary | ICD-10-CM | POA: Diagnosis not present

## 2017-08-08 MED ORDER — METHOCARBAMOL 500 MG PO TABS: 500.0000 mg | ORAL_TABLET | Freq: Two times a day (BID) | ORAL | 0 refills | Status: AC | PRN

## 2017-08-08 MED ORDER — MELOXICAM 15 MG PO TABS: 15.0000 mg | ORAL_TABLET | Freq: Every day | ORAL | 0 refills | Status: AC

## 2017-08-08 NOTE — Discharge Instructions (Signed)
You have degenerative disk disease of your spine. Take the prescription meds as directed. Follow-up with the specialist as referred.

## 2017-08-08 NOTE — ED Provider Notes (Signed)
[facility] Emergency Department Provider Note ____________________________________________  Time seen: 1329  I have reviewed the triage vital signs and the nursing notes.  HISTORY  Chief Complaint  Back Pain and Neck Pain  HPI Linda Giles is a 52 y.o. female presents to the ED for evaluation of continued thoracic back pain.  Patient was evaluated here several weeks earlier for the same complaint.  At that time she reported a 2-day complaint of pain acutely after twisting injury.  She also noted a long-standing history of chronic, intermittent mid back pain.  Patient was evaluated here and placed on anti-inflammatories and muscle relaxers.  She reports she is taking her medications as prescribed, but the medications have been taken as prescribed.  She reports that her employer has finally approved and accepted her Worker's Comp. Claim.  Denies any chest pain, shortness of breath, or re-injury in the interim.  She reports bilateral musculoskeletal pain across the upper and mid trapezius muscles.  She denies any distal paresthesias, grip changes, incontinence, or LE weakness.  She has not been back to work since the injury according to the patient.  Past Medical History:  Diagnosis Date  . Chronic back pain   . Hypertension     Patient Active Problem List   Diagnosis Date Noted  . Chest pain 02/11/2016    Past Surgical History:  Procedure Laterality Date  . ABDOMINAL HYSTERECTOMY      Prior to Admission medications   Medication Sig Start Date End Date Taking? Authorizing Provider  aspirin EC 81 MG EC tablet Take 1 tablet (81 mg total) by mouth daily. 02/12/16   Auburn Bilberry, MD  cyclobenzaprine (FLEXERIL) 10 MG tablet Take 1 tablet (10 mg total) by mouth 3 (three) times daily as needed for muscle spasms. 03/16/17   Triplett, Rulon Eisenmenger B, FNP  meloxicam (MOBIC) 15 MG tablet Take 1 tablet (15 mg total) by mouth daily. 08/08/17 09/07/17  Jailynne Opperman, Charlesetta Ivory, PA-C   methocarbamol (ROBAXIN) 500 MG tablet Take 1 tablet (500 mg total) by mouth 2 (two) times daily as needed for muscle spasms. 08/08/17 09/07/17  Kammie Scioli, Charlesetta Ivory, PA-C  metoprolol tartrate (LOPRESSOR) 25 MG tablet Take 1 tablet (25 mg total) by mouth 2 (two) times daily. 02/12/16   Auburn Bilberry, MD  vitamin B-12 (CYANOCOBALAMIN) 1000 MCG tablet Take 1,000 mcg by mouth daily.    [provider]    Allergies Compazine [prochlorperazine edisylate]  Family History  Problem Relation Age of Onset  . Heart disease Mother   . Hypertension Mother   . CVA Mother   . Colon cancer Mother   . Cervical cancer Mother     Social History Social History   Tobacco Use  . Smoking status: Never Smoker  . Smokeless tobacco: Never Used  Substance Use Topics  . Alcohol use: No  . Drug use: No    Review of Systems  Constitutional: Negative for fever. Eyes: Negative for visual changes. ENT: Negative for sore throat. Cardiovascular: Negative for chest pain. Respiratory: Negative for shortness of breath. Gastrointestinal: Negative for abdominal pain, vomiting and diarrhea. Genitourinary: Negative for dysuria. Musculoskeletal: Positive for mid back pain. Skin: Negative for rash. Neurological: Negative for headaches, focal weakness or numbness. ____________________________________________  PHYSICAL EXAM:  VITAL SIGNS: ED Triage Vitals  Enc Vitals Group     BP 08/08/17 1223 (!) 167/78     Pulse Rate 08/08/17 1223 74     Resp 08/08/17 1223 16  Temp 08/08/17 1223 98.6 F (37 C)     Temp Source 08/08/17 1223 Oral     SpO2 08/08/17 1223 99 %     Weight 08/08/17 1222 240 lb (108.9 kg)     Height 08/08/17 1222 5\' 6"  (1.676 m)     Head Circumference --      Peak Flow --      Pain Score 08/08/17 1222 7     Pain Loc --      Pain Edu? --      Excl. in GC? --     Constitutional: Alert and oriented. Well appearing and in no distress. Head: Normocephalic and  atraumatic. Neck: Supple.  Limited range of motion secondary to subjective complaints of tightness.  No palpable muscle spasm across the upper trapezius and scapulothoracic musculature.  Retracted midline tenderness is noted. Hematological/Lymphatic/Immunological: No cervical lymphadenopathy. Cardiovascular: Normal rate, regular rhythm. Normal distal pulses. Respiratory: Normal respiratory effort. No wheezes/rales/rhonchi. Gastrointestinal: Soft and nontender. No distention. Musculoskeletal: Normal spinal alignment without midline tenderness, spasm, deformity, or step-off.  Nontender with normal range of motion in all extremities.  Neurologic: Regular 2 through 12 grossly intact.  Normal UE/LE DTRs bilaterally.  Normal gait without ataxia. Normal speech and language. No gross focal neurologic deficits are appreciated. Skin:  Skin is warm, dry and intact. No rash noted. ____________________________________________   RADIOLOGY Thoracic Spine (03/16/2017) FINDINGS: Multilevel degenerative disc disease with small anterior osteophytes. No fractures. ____________________________________________  INITIAL IMPRESSION / ASSESSMENT AND PLAN / ED COURSE  Patient with ED evaluation of injuries sustained while at work.  Patient with a recently approved Worker's Comp. claim presents for ongoing symptom management.  She is requesting a refill of medications.  Patient will be discharged with prescriptions for meloxicam and methocarbamol.  She is encouraged to follow-up with her primary care provider or the specialist to which she was referred.  Is also encouraged to consider nonmedicinal interventions including massage therapy, physical therapy, and acupuncture.  Return precautions have been reviewed. ____________________________________________  FINAL CLINICAL IMPRESSION(S) / ED DIAGNOSES  Final diagnoses:  Chronic midline thoracic back pain  DDD (degenerative disc disease), thoracolumbar      Lissa HoardMenshew,  Miko Sirico V Bacon, PA-C 08/09/17 1742    Jeanmarie PlantMcShane, James A, MD 08/09/17 2328

## 2017-08-08 NOTE — ED Triage Notes (Signed)
Pt arrives to ED c/o of middle back pain to upper back and posterior neck. State was injured at work on 7/3. Was seen after injury. Alert, oriented, ambulatory. No distress noted. Turning neck in triage.   Gillian ScarceWendys is employer [facility] [employer] [place]. States will be filing WC today.

## 2017-11-19 ENCOUNTER — Other Ambulatory Visit: Payer: Self-pay

## 2017-11-19 ENCOUNTER — Emergency Department
Admission: EM | Admit: 2017-11-19 | Discharge: 2017-11-19 | Disposition: A | Discharge status: Home / Self Care | Payer: BLUE CROSS/BLUE SHIELD | Attending: Student in an Organized Health Care Education/Training Program | Admitting: Student in an Organized Health Care Education/Training Program

## 2017-11-19 ENCOUNTER — Encounter: Payer: Self-pay | Admitting: Emergency Medicine

## 2017-11-19 DIAGNOSIS — I10 Essential (primary) hypertension: Secondary | ICD-10-CM | POA: Insufficient documentation

## 2017-11-19 DIAGNOSIS — Z7982 Long term (current) use of aspirin: Secondary | ICD-10-CM | POA: Diagnosis not present

## 2017-11-19 DIAGNOSIS — M62830 Muscle spasm of back: Secondary | ICD-10-CM | POA: Diagnosis present

## 2017-11-19 DIAGNOSIS — Z79899 Other long term (current) drug therapy: Secondary | ICD-10-CM | POA: Insufficient documentation

## 2017-11-19 MED ORDER — MELOXICAM 15 MG PO TABS: 15.0000 mg | ORAL_TABLET | Freq: Every day | ORAL | 0 refills | Status: DC

## 2017-11-19 MED ORDER — ORPHENADRINE CITRATE 30 MG/ML IJ SOLN
60.0000 mg | Freq: Once | INTRAMUSCULAR | Status: AC
  Administered 2017-11-19: 60 mg via INTRAMUSCULAR
  Filled 2017-11-19: qty 2

## 2017-11-19 MED ORDER — KETOROLAC TROMETHAMINE 30 MG/ML IJ SOLN
30.0000 mg | Freq: Once | INTRAMUSCULAR | Status: AC
  Administered 2017-11-19: 30 mg via INTRAMUSCULAR
  Filled 2017-11-19: qty 1

## 2017-11-19 MED ORDER — METHOCARBAMOL 500 MG PO TABS: 500.0000 mg | ORAL_TABLET | Freq: Four times a day (QID) | ORAL | 0 refills | Status: DC

## 2017-11-19 NOTE — ED Triage Notes (Signed)
Pt reports that she has had back pain for the last three weeks but has had constant spasms that is going down and right arm and leg.

## 2017-11-19 NOTE — ED Provider Notes (Signed)
[facility] Emergency Department Provider Note  ____________________________________________  Time seen: Approximately 11:13 PM  I have reviewed the triage vital signs and the nursing notes.   HISTORY  Chief Complaint Back Pain    HPI Linda Giles is a 52 y.o. female who presents the emergency department complaining of back spasms.  Patient  presents the emergency department complaining of intermittent back spasms.  Patient works in [employer], states that she sweats throughout the day, spends long periods on her feet.  Patient has intermittent issues with spasms in her lower and mid back.  Patient reports that over the years she has been intermittently on anti-inflammatories and muscle relaxers.  Patient was seen by myself several months ago with similar presentation and reports that after receiving muscle relaxer and anti-inflammatory symptoms had resolved.  Patient denies any urinary symptoms.  She denies any abdominal pain or GI symptoms.  No medications for this complaint prior to arrival.  No other complaints at this time.   Past Medical History:  Diagnosis Date  . Chronic back pain   . Hypertension     Patient Active Problem List   Diagnosis Date Noted  . Chest pain 02/11/2016    Past Surgical History:  Procedure Laterality Date  . ABDOMINAL HYSTERECTOMY      Prior to Admission medications   Medication Sig Start Date End Date Taking? Authorizing Provider  aspirin EC 81 MG EC tablet Take 1 tablet (81 mg total) by mouth daily. 02/12/16   Auburn Bilberry, MD  cyclobenzaprine (FLEXERIL) 10 MG tablet Take 1 tablet (10 mg total) by mouth 3 (three) times daily as needed for muscle spasms. 03/16/17   Triplett, Rulon Eisenmenger B, FNP  meloxicam (MOBIC) 15 MG tablet Take 1 tablet (15 mg total) by mouth daily. 11/19/17   Burnie Therien, Delorise Royals, PA-C  methocarbamol (ROBAXIN) 500 MG tablet Take 1 tablet (500 mg total) by mouth 4 (four) times daily. 11/19/17   Marine Lezotte,  Delorise Royals, PA-C  metoprolol tartrate (LOPRESSOR) 25 MG tablet Take 1 tablet (25 mg total) by mouth 2 (two) times daily. 02/12/16   Auburn Bilberry, MD  vitamin B-12 (CYANOCOBALAMIN) 1000 MCG tablet Take 1,000 mcg by mouth daily.    [provider]    Allergies Compazine [prochlorperazine edisylate]  Family History  Problem Relation Age of Onset  . Heart disease Mother   . Hypertension Mother   . CVA Mother   . Colon cancer Mother   . Cervical cancer Mother     Social History Social History   Tobacco Use  . Smoking status: Never Smoker  . Smokeless tobacco: Never Used  Substance Use Topics  . Alcohol use: No  . Drug use: No     Review of Systems  Constitutional: No fever/chills Eyes: No visual changes.  Cardiovascular: no chest pain. Respiratory: no cough. No SOB. Gastrointestinal: No abdominal pain.  No nausea, no vomiting.  No diarrhea.  No constipation. Genitourinary: Negative for dysuria. No hematuria Musculoskeletal: Positive for mid back muscle spasms. Skin: Negative for rash, abrasions, lacerations, ecchymosis. Neurological: Negative for headaches, focal weakness or numbness. 10-point ROS otherwise negative.  ____________________________________________   PHYSICAL EXAM:  VITAL SIGNS: ED Triage Vitals  Enc Vitals Group     BP 11/19/17 2156 (!) 152/80     Pulse Rate 11/19/17 2156 93     Resp 11/19/17 2156 18     Temp 11/19/17 2156 98.5 F (36.9 C)     Temp Source 11/19/17 2156 Oral  SpO2 11/19/17 2156 99 %     Weight 11/19/17 2157 248 lb (112.5 kg)     Height 11/19/17 2157 5\' 6"  (1.676 m)     Head Circumference --      Peak Flow --      Pain Score 11/19/17 2157 8     Pain Loc --      Pain Edu? --      Excl. in GC? --      Constitutional: Alert and oriented. Well appearing and in no acute distress. Eyes: Conjunctivae are normal. PERRL. EOMI. Head: Atraumatic. Neck: No stridor.    Cardiovascular: Normal rate, regular rhythm. Normal  S1 and S2.  Good peripheral circulation. Respiratory: Normal respiratory effort without tachypnea or retractions. Lungs CTAB. Good air entry to the bases with no decreased or absent breath sounds. Gastrointestinal: Bowel sounds 4 quadrants. Soft and nontender to palpation. No guarding or rigidity. No palpable masses. No distention. No CVA tenderness. Musculoskeletal: Full range of motion to all extremities. No gross deformities appreciated.  Visualization of the thoracic and lumbar spine reveals no visible abnormality or signs of trauma.  Palpation reveals no tenderness to palpation midline, diffuse tenderness to palpation right paraspinal muscle groups with palpable spasms.  No other palpable abnormality in the thoracic and lumbar spine.  No tenderness to palpation of bilateral sciatic notches.  Dorsalis pedis pulse intact bilateral lower extremities.  Sensation intact and equal bilateral lower extremities. Neurologic:  Normal speech and language. No gross focal neurologic deficits are appreciated.  Skin:  Skin is warm, dry and intact. No rash noted. Psychiatric: Mood and affect are normal. Speech and behavior are normal. Patient exhibits appropriate insight and judgement.   ____________________________________________   LABS (all labs ordered are listed, but only abnormal results are displayed)  Labs Reviewed - No data to display ____________________________________________  EKG   ____________________________________________  RADIOLOGY   No results found.  ____________________________________________    PROCEDURES  Procedure(s) performed:    Procedures    Medications  ketorolac (TORADOL) 30 MG/ML injection 30 mg (has no administration in time range)  orphenadrine (NORFLEX) injection 60 mg (has no administration in time range)     ____________________________________________   INITIAL IMPRESSION / ASSESSMENT AND PLAN / ED COURSE  Pertinent labs & imaging results  that were available during my care of the patient were reviewed by me and considered in my medical decision making (see chart for details).  Review of the [place] CSRS was performed in accordance of the [facility] prior to dispensing any controlled drugs.      Patient's diagnosis is consistent with thoracic muscle spasm.  Patient presents the emergency department complaining of spasms in her thoracic spine.  Patient spends long periods of time on her feet, in a warm environment.  Patient does drink plenty of fluids but does not replenish her electrolytes.  Discussed electrolyte replenishment with patient as well as stretching.  No indication for imaging or labs at this time.  Patient is not clinically dehydrated and does not require fluid replenishment.  Patient is given Toradol and Norflex injections in the emergency department.  Patient will be prescribed meloxicam and Robaxin for symptom relief.  Follow-up with primary care as needed..  Patient is given ED precautions to return to the ED for any worsening or new symptoms.     ____________________________________________  FINAL CLINICAL IMPRESSION(S) / ED DIAGNOSES  Final diagnoses:  Spasm of thoracic back muscle      NEW MEDICATIONS STARTED DURING THIS  VISIT:  ED Discharge Orders         Ordered    meloxicam (MOBIC) 15 MG tablet  Daily     11/19/17 2329    methocarbamol (ROBAXIN) 500 MG tablet  4 times daily     11/19/17 2329              This chart was dictated using voice recognition software/Dragon. Despite best efforts to proofread, errors can occur which can change the meaning. Any change was purely unintentional.    Racheal Patches, PA-C 11/19/17 2329    Willy Eddy, MD 11/19/17 [phone]

## 2017-11-24 ENCOUNTER — Other Ambulatory Visit: Payer: Self-pay

## 2017-11-24 ENCOUNTER — Emergency Department: Payer: BLUE CROSS/BLUE SHIELD

## 2017-11-24 ENCOUNTER — Emergency Department
Admission: EM | Admit: 2017-11-24 | Discharge: 2017-11-24 | Disposition: A | Discharge status: Home / Self Care | Payer: BLUE CROSS/BLUE SHIELD | Attending: Emergency Medicine | Admitting: Emergency Medicine

## 2017-11-24 ENCOUNTER — Encounter: Payer: Self-pay | Admitting: Emergency Medicine

## 2017-11-24 DIAGNOSIS — M1711 Unilateral primary osteoarthritis, right knee: Secondary | ICD-10-CM | POA: Diagnosis not present

## 2017-11-24 DIAGNOSIS — Y929 Unspecified place or not applicable: Secondary | ICD-10-CM | POA: Diagnosis not present

## 2017-11-24 DIAGNOSIS — Z7982 Long term (current) use of aspirin: Secondary | ICD-10-CM | POA: Insufficient documentation

## 2017-11-24 DIAGNOSIS — Y999 Unspecified external cause status: Secondary | ICD-10-CM | POA: Insufficient documentation

## 2017-11-24 DIAGNOSIS — M25561 Pain in right knee: Secondary | ICD-10-CM

## 2017-11-24 DIAGNOSIS — S8391XA Sprain of unspecified site of right knee, initial encounter: Secondary | ICD-10-CM | POA: Insufficient documentation

## 2017-11-24 DIAGNOSIS — Y939 Activity, unspecified: Secondary | ICD-10-CM | POA: Insufficient documentation

## 2017-11-24 DIAGNOSIS — X58XXXA Exposure to other specified factors, initial encounter: Secondary | ICD-10-CM | POA: Insufficient documentation

## 2017-11-24 DIAGNOSIS — I10 Essential (primary) hypertension: Secondary | ICD-10-CM | POA: Diagnosis not present

## 2017-11-24 DIAGNOSIS — Z79899 Other long term (current) drug therapy: Secondary | ICD-10-CM | POA: Diagnosis not present

## 2017-11-24 DIAGNOSIS — S86911A Strain of unspecified muscle(s) and tendon(s) at lower leg level, right leg, initial encounter: Secondary | ICD-10-CM

## 2017-11-24 DIAGNOSIS — S8991XA Unspecified injury of right lower leg, initial encounter: Secondary | ICD-10-CM | POA: Diagnosis present

## 2017-11-24 NOTE — Discharge Instructions (Signed)
Your x-ray is unremarkable but does show underlying arthritis.  Use an Ace wrap or knee brace along with anti-inflammatory medicines for the next week and follow-up with your doctor.

## 2017-11-24 NOTE — ED Notes (Signed)
Dr. Stafford in with patient. 

## 2017-11-24 NOTE — ED Provider Notes (Signed)
[facility] Emergency Department Provider Note  ____________________________________________  Time seen: Approximately 2:14 AM  I have reviewed the triage vital signs and the nursing notes.   HISTORY  Chief Complaint Knee Pain    HPI Linda Giles is a 52 y.o. female with a history of chronic back pain and hypertension who complains of right knee pain, gradual onset today, constant, worse with weightbearing and movement.  No alleviating factors.  Nonradiating.  Moderate intensity.  Denies any falls or trauma.  She does note that she is work the last 7 days straight due to short staffing at work.  She has been picking up additional tasks with a lot of time on her feet, moving heavy objects, and unloading a truck.      Past Medical History:  Diagnosis Date  . Chronic back pain   . Hypertension      Patient Active Problem List   Diagnosis Date Noted  . Chest pain 02/11/2016     Past Surgical History:  Procedure Laterality Date  . ABDOMINAL HYSTERECTOMY       Prior to Admission medications   Medication Sig Start Date End Date Taking? Authorizing Provider  aspirin EC 81 MG EC tablet Take 1 tablet (81 mg total) by mouth daily. 02/12/16   Auburn Bilberry, MD  cyclobenzaprine (FLEXERIL) 10 MG tablet Take 1 tablet (10 mg total) by mouth 3 (three) times daily as needed for muscle spasms. 03/16/17   Triplett, Rulon Eisenmenger B, FNP  meloxicam (MOBIC) 15 MG tablet Take 1 tablet (15 mg total) by mouth daily. 11/19/17   Cuthriell, Delorise Royals, PA-C  methocarbamol (ROBAXIN) 500 MG tablet Take 1 tablet (500 mg total) by mouth 4 (four) times daily. 11/19/17   Cuthriell, Delorise Royals, PA-C  metoprolol tartrate (LOPRESSOR) 25 MG tablet Take 1 tablet (25 mg total) by mouth 2 (two) times daily. 02/12/16   Auburn Bilberry, MD  vitamin B-12 (CYANOCOBALAMIN) 1000 MCG tablet Take 1,000 mcg by mouth daily.    [provider]     Allergies Compazine [prochlorperazine  edisylate]   Family History  Problem Relation Age of Onset  . Heart disease Mother   . Hypertension Mother   . CVA Mother   . Colon cancer Mother   . Cervical cancer Mother     Social History Social History   Tobacco Use  . Smoking status: Never Smoker  . Smokeless tobacco: Never Used  Substance Use Topics  . Alcohol use: No  . Drug use: No    Review of Systems  Constitutional:   No fever or chills.  ENT:   No sore throat. No rhinorrhea. Cardiovascular:   No chest pain or syncope. Respiratory:   No dyspnea or cough. Gastrointestinal:   Negative for abdominal pain, vomiting and diarrhea.  Musculoskeletal:   Right knee pain as above.  Chronic low back pain unchanged All other systems reviewed and are negative except as documented above in ROS and HPI.  ____________________________________________   PHYSICAL EXAM:  VITAL SIGNS: ED Triage Vitals  Enc Vitals Group     BP 11/24/17 0102 (!) 133/57     Pulse Rate 11/24/17 0102 76     Resp 11/24/17 0102 18     Temp 11/24/17 0102 98.5 F (36.9 C)     Temp Source 11/24/17 0102 Oral     SpO2 11/24/17 0102 98 %     Weight 11/24/17 0103 245 lb (111.1 kg)     Height 11/24/17 0103 5\' 6"  (  1.676 m)     Head Circumference --      Peak Flow --      Pain Score 11/24/17 0102 10     Pain Loc --      Pain Edu? --      Excl. in GC? --     Vital signs reviewed, nursing assessments reviewed.   Constitutional:   Alert and oriented. Non-toxic appearance. Eyes:   Conjunctivae are normal. EOMI. PERRL. ENT      Head:   Normocephalic and atraumatic.      Nose:   No congestion/rhinnorhea.       Mouth/Throat:   MMM, no pharyngeal erythema. No peritonsillar mass.       Neck:   No meningismus. Full ROM. Hematological/Lymphatic/Immunilogical:   No cervical lymphadenopathy. Cardiovascular:   RRR. Symmetric bilateral radial and DP pulses.  No murmurs. Cap refill less than 2 seconds. Respiratory:   Normal respiratory effort without  tachypnea/retractions. Breath sounds are clear and equal bilaterally. No wheezes/rales/rhonchi. Gastrointestinal:   Soft and nontender. Non distended. There is no CVA tenderness.  No rebound, rigidity, or guarding. Genitourinary:   deferred Musculoskeletal:   Normal range of motion in all extremities. No joint effusions.  No lower extremity tenderness.  No edema.  No midline spinal tenderness.  Right knee joint is stable.  No joint effusion.  No inflammatory changes.  Symmetric calf circumference, no calf tenderness or palpable cord. Neurologic:   Normal speech and language.  Motor grossly intact. No acute focal neurologic deficits are appreciated.  Skin:    Skin is warm, dry and intact. No rash noted.  No petechiae, purpura, or bullae.  ____________________________________________    LABS (pertinent positives/negatives) (all labs ordered are listed, but only abnormal results are displayed) Labs Reviewed - No data to display ____________________________________________   EKG    ____________________________________________    RADIOLOGY  Dg Knee Complete 4 Views Right  Result Date: 11/24/2017 CLINICAL DATA:  Initial evaluation for acute right knee pain. No known injury. EXAM: RIGHT KNEE - COMPLETE 4+ VIEW COMPARISON:  None. FINDINGS: No acute fracture or dislocation. Small joint effusion noted within the suprapatellar recess. Mild tricompartmental degenerative osteoarthrosis. Osseous mineralization normal. No acute soft tissue abnormality. IMPRESSION: 1. No acute osseous abnormality about the right knee. 2. Small joint effusion. 3. Mild tricompartmental degenerative osteoarthrosis. Electronically Signed   By: Rise Mu M.D.   On: 11/24/2017 01:48    ____________________________________________   PROCEDURES Procedures  ____________________________________________    CLINICAL IMPRESSION / ASSESSMENT AND PLAN / ED COURSE  Pertinent labs & imaging results that were  available during my care of the patient were reviewed by me and considered in my medical decision making (see chart for details).    Patient presents with right knee pain.  Due to her obesity, I obtained an x-ray to evaluate for spontaneous fracture.  X-rays negative for any acute injury but does show underlying osteoarthritis which is likely contributing to her symptoms in the setting of overuse and increased strenuous activity.  Recommend rice therapy and follow-up with primary care.  Continue anti-inflammatories.  Doubt infection septic arthritis osteomyelitis fracture or DVT.      ____________________________________________   FINAL CLINICAL IMPRESSION(S) / ED DIAGNOSES    Final diagnoses:  Acute pain of right knee  Strain of right knee, initial encounter  Osteoarthritis of right knee, unspecified osteoarthritis type     ED Discharge Orders    None      Portions of this note  were generated with dragon dictation software. Dictation errors may occur despite best attempts at proofreading.    Sharman Cheek, MD 11/24/17 [phone]

## 2017-11-24 NOTE — ED Triage Notes (Signed)
Pt here with c/o right knee pain that began today while at work, denies injury to area, walked with limp to triage.

## 2017-11-24 NOTE — ED Notes (Signed)
Patient reports right knee pain all day.  Reports noticed it when she woke up but became progressively worse while at work.  Denies any known type of injury.  Patient is able to bear weight, but causes pain.

## 2017-11-27 ENCOUNTER — Emergency Department
Admission: EM | Admit: 2017-11-27 | Discharge: 2017-11-27 | Disposition: A | Discharge status: Home / Self Care | Payer: BLUE CROSS/BLUE SHIELD | Attending: Emergency Medicine | Admitting: Emergency Medicine

## 2017-11-27 ENCOUNTER — Encounter: Payer: Self-pay | Admitting: Emergency Medicine

## 2017-11-27 ENCOUNTER — Other Ambulatory Visit: Payer: Self-pay

## 2017-11-27 DIAGNOSIS — Z7982 Long term (current) use of aspirin: Secondary | ICD-10-CM | POA: Diagnosis not present

## 2017-11-27 DIAGNOSIS — M25561 Pain in right knee: Secondary | ICD-10-CM | POA: Diagnosis present

## 2017-11-27 DIAGNOSIS — M1711 Unilateral primary osteoarthritis, right knee: Secondary | ICD-10-CM | POA: Insufficient documentation

## 2017-11-27 DIAGNOSIS — Z79899 Other long term (current) drug therapy: Secondary | ICD-10-CM | POA: Diagnosis not present

## 2017-11-27 DIAGNOSIS — I10 Essential (primary) hypertension: Secondary | ICD-10-CM | POA: Insufficient documentation

## 2017-11-27 MED ORDER — PREDNISONE 50 MG PO TABS: 50.0000 mg | ORAL_TABLET | Freq: Every day | ORAL | 0 refills | Status: DC

## 2017-11-27 NOTE — ED Notes (Signed)
Pt verbally stated that she understood how to properly use ortho devices.

## 2017-11-27 NOTE — ED Triage Notes (Signed)
Patient ambulatory to triage with steady gait, without difficulty or distress noted; pt c/o right knee pain & swelling with no known injury; st was dx with arthritis

## 2017-11-27 NOTE — ED Notes (Signed)
Pt walked to [facility] with slight limp, denied need for wheelchair. Has anti-inflammatory med that she takes for her back-has tried for her knee with no relief. Is on her feet all day with her job. NAD.

## 2017-11-27 NOTE — ED Provider Notes (Signed)
[facility] Emergency Department Provider Note  ____________________________________________  Time seen: Approximately 7:43 PM  I have reviewed the triage vital signs and the nursing notes.   HISTORY  Chief Complaint No chief complaint on file.    HPI Linda Giles is a 52 y.o. female who presents the emergency department for complaint of ongoing right knee pain.  Patient was seen in this department 3 days prior, diagnosed with arthritis of the knee.  Patient is on meloxicam for a previously diagnosed lumbar strain.  Patient is continued same but states that the medication has not alleviated her symptoms.  Patient reports that the majority of the pain is on the medial aspect of the knee.  She denies any specific trauma.  Patient denies any new complaints or issues from her visit 3 days prior, but she reports only that symptoms have not seemed to improve.  Patient works on her feet and spends long hours of time walking and standing.  Patient reports that with no improvement in pain, she does not believe she can stand and walk for long periods at work.  Patient denies any back pain, bilateral hip pain, bilateral ankle pain.  She denies any edema or erythema to her knee.   Past Medical History:  Diagnosis Date  . Chronic back pain   . Hypertension     Patient Active Problem List   Diagnosis Date Noted  . Chest pain 02/11/2016    Past Surgical History:  Procedure Laterality Date  . ABDOMINAL HYSTERECTOMY      Prior to Admission medications   Medication Sig Start Date End Date Taking? Authorizing Provider  aspirin EC 81 MG EC tablet Take 1 tablet (81 mg total) by mouth daily. 02/12/16   Auburn Bilberry, MD  cyclobenzaprine (FLEXERIL) 10 MG tablet Take 1 tablet (10 mg total) by mouth 3 (three) times daily as needed for muscle spasms. 03/16/17   Triplett, Rulon Eisenmenger B, FNP  meloxicam (MOBIC) 15 MG tablet Take 1 tablet (15 mg total) by mouth daily. 11/19/17   Cuthriell,  Delorise Royals, PA-C  methocarbamol (ROBAXIN) 500 MG tablet Take 1 tablet (500 mg total) by mouth 4 (four) times daily. 11/19/17   Cuthriell, Delorise Royals, PA-C  metoprolol tartrate (LOPRESSOR) 25 MG tablet Take 1 tablet (25 mg total) by mouth 2 (two) times daily. 02/12/16   Auburn Bilberry, MD  vitamin B-12 (CYANOCOBALAMIN) 1000 MCG tablet Take 1,000 mcg by mouth daily.    [provider]    Allergies Compazine [prochlorperazine edisylate]  Family History  Problem Relation Age of Onset  . Heart disease Mother   . Hypertension Mother   . CVA Mother   . Colon cancer Mother   . Cervical cancer Mother     Social History Social History   Tobacco Use  . Smoking status: Never Smoker  . Smokeless tobacco: Never Used  Substance Use Topics  . Alcohol use: No  . Drug use: No     Review of Systems  Constitutional: No fever/chills Eyes: No visual changes.  Cardiovascular: no chest pain. Respiratory: no cough. No SOB. Gastrointestinal: No abdominal pain.  No nausea, no vomiting.   Musculoskeletal: Positive for right knee pain Skin: Negative for rash, abrasions, lacerations, ecchymosis. Neurological: Negative for headaches, focal weakness or numbness. 10-point ROS otherwise negative.  ____________________________________________   PHYSICAL EXAM:  VITAL SIGNS: ED Triage Vitals  Enc Vitals Group     BP 11/27/17 1933 (!) 152/59     Pulse Rate 11/27/17  1933 67     Resp 11/27/17 1933 18     Temp 11/27/17 1933 98.4 F (36.9 C)     Temp Source 11/27/17 1933 Oral     SpO2 11/27/17 1933 100 %     Weight 11/27/17 1934 245 lb (111.1 kg)     Height 11/27/17 1934 5\' 6"  (1.676 m)     Head Circumference --      Peak Flow --      Pain Score 11/27/17 1933 8     Pain Loc --      Pain Edu? --      Excl. in GC? --      Constitutional: Alert and oriented. Well appearing and in no acute distress. Eyes: Conjunctivae are normal. PERRL. EOMI. Head: Atraumatic. Neck: No stridor.     Cardiovascular: Normal rate, regular rhythm. Normal S1 and S2.  Good peripheral circulation. Respiratory: Normal respiratory effort without tachypnea or retractions. Lungs CTAB. Good air entry to the bases with no decreased or absent breath sounds. Musculoskeletal: Full range of motion to all extremities. No gross deformities appreciated.  Visualization of the right knee reveals no gross edema, erythema compared to the left knee.  No significant warmth with palpation.  Patient is able to fully extend and flex the knee with coaxing.  Patient is mildly tender to palpation along the medial joint line with no other tenderness to palpation.  No palpable abnormality.  No ballottement.  Varus, valgus, Lachman's, McMurray's is negative.  Dorsalis pedis pulse intact distally.  Sensation intact distally. Neurologic:  Normal speech and language. No gross focal neurologic deficits are appreciated.  Skin:  Skin is warm, dry and intact. No rash noted. Psychiatric: Mood and affect are normal. Speech and behavior are normal. Patient exhibits appropriate insight and judgement.   ____________________________________________   LABS (all labs ordered are listed, but only abnormal results are displayed)  Labs Reviewed - No data to display ____________________________________________  EKG   ____________________________________________  RADIOLOGY  Imaging from 11/24/2017 was reviewed.  This reveals mild tricompartmental arthritis with minimal joint effusion.  No fractures or other acute osseous abnormality.  No results found.  ____________________________________________    PROCEDURES  Procedure(s) performed:    Procedures    Medications - No data to display   ____________________________________________   INITIAL IMPRESSION / ASSESSMENT AND PLAN / ED COURSE  Pertinent labs & imaging results that were available during my care of the patient were reviewed by me and considered in my medical  decision making (see chart for details).  Review of the [place] CSRS was performed in accordance of the [facility] prior to dispensing any controlled drugs.      Patient's diagnosis is consistent with osteoarthritis of the right knee.  Patient presents the emergency department complaining of ongoing knee pain to the right knee.  Patient was evaluated 3 days ago.  No subsequent trauma or injury.  Patient is concerned as knee pain is not improved.  Patient has been taking meloxicam without complete resolution.  On exam, patient does have tenderness along the medial joint line.  X-ray from previous visit was reviewed.  At this point, no indication of enlarged joint effusion.  No warmth, erythema to be concern of septic joint.  Additionally, I do not suspect gout at this time.  I suspect that patient's symptoms are consistent with knee pain from mild tricompartmental arthritis.  Patient is to continue meloxicam.  I will add a short course of steroid.  Patient is given  knee immobilizer and crutches for immobilization and assistance with weightbearing to help improve symptoms.  Follow-up with orthopedics.. Patient is given ED precautions to return to the ED for any worsening or new symptoms.     ____________________________________________  FINAL CLINICAL IMPRESSION(S) / ED DIAGNOSES  Final diagnoses:  None      NEW MEDICATIONS STARTED DURING THIS VISIT:  ED Discharge Orders    None          This chart was dictated using voice recognition software/Dragon. Despite best efforts to proofread, errors can occur which can change the meaning. Any change was purely unintentional.    Racheal PatchesCuthriell, Jonathan D, PA-C 11/27/17 2011    Charlynne PanderYao, David Hsienta, MD 11/29/17 [phone]

## 2018-01-16 ENCOUNTER — Encounter: Payer: Self-pay | Admitting: Emergency Medicine

## 2018-01-16 ENCOUNTER — Emergency Department
Admission: EM | Admit: 2018-01-16 | Discharge: 2018-01-16 | Disposition: A | Discharge status: Home / Self Care | Payer: BLUE CROSS/BLUE SHIELD | Attending: Emergency Medicine | Admitting: Emergency Medicine

## 2018-01-16 ENCOUNTER — Other Ambulatory Visit: Payer: Self-pay

## 2018-01-16 ENCOUNTER — Emergency Department: Payer: BLUE CROSS/BLUE SHIELD

## 2018-01-16 DIAGNOSIS — I10 Essential (primary) hypertension: Secondary | ICD-10-CM | POA: Insufficient documentation

## 2018-01-16 DIAGNOSIS — Y939 Activity, unspecified: Secondary | ICD-10-CM | POA: Insufficient documentation

## 2018-01-16 DIAGNOSIS — Z79899 Other long term (current) drug therapy: Secondary | ICD-10-CM | POA: Diagnosis not present

## 2018-01-16 DIAGNOSIS — Z7982 Long term (current) use of aspirin: Secondary | ICD-10-CM | POA: Insufficient documentation

## 2018-01-16 DIAGNOSIS — Y929 Unspecified place or not applicable: Secondary | ICD-10-CM | POA: Diagnosis not present

## 2018-01-16 DIAGNOSIS — S86911A Strain of unspecified muscle(s) and tendon(s) at lower leg level, right leg, initial encounter: Secondary | ICD-10-CM | POA: Diagnosis not present

## 2018-01-16 DIAGNOSIS — Y999 Unspecified external cause status: Secondary | ICD-10-CM | POA: Diagnosis not present

## 2018-01-16 DIAGNOSIS — S8991XA Unspecified injury of right lower leg, initial encounter: Secondary | ICD-10-CM | POA: Diagnosis present

## 2018-01-16 DIAGNOSIS — X58XXXA Exposure to other specified factors, initial encounter: Secondary | ICD-10-CM | POA: Diagnosis not present

## 2018-01-16 MED ORDER — HYDROCODONE-ACETAMINOPHEN 5-325 MG PO TABS
1.0000 | ORAL_TABLET | Freq: Once | ORAL | Status: AC
  Administered 2018-01-16: 1 via ORAL
  Filled 2018-01-16: qty 1

## 2018-01-16 MED ORDER — MELOXICAM 15 MG PO TABS: 15.0000 mg | ORAL_TABLET | Freq: Every day | ORAL | 0 refills | Status: DC

## 2018-01-16 MED ORDER — HYDROCODONE-ACETAMINOPHEN 5-325 MG PO TABS: 1.0000 | ORAL_TABLET | ORAL | 0 refills | Status: DC | PRN

## 2018-01-16 NOTE — ED Provider Notes (Signed)
[facility] Emergency Department Provider Note  ____________________________________________  Time seen: Approximately 10:15 PM  I have reviewed the triage vital signs and the nursing notes.   HISTORY  Chief Complaint Knee Pain    HPI Linda Giles is a 53 y.o. female who presents the emergency department complaining of right knee pain.  Patient was seen by myself approximately 2 months ago and diagnosed with tricompartmental arthritis.  Patient has an appointment to follow-up with orthopedics next week.  Patient reports that tonight, her left knee gave out, her "body going 1 way in my lower leg going the other."  Patient did not land on the knee.  She not hit her head.  Patient is concerned as she has had significant increase in the right knee pain.  Patient reports that after completing the meloxicam course her knee had been doing much better until tonight.  No other injury or complaint.  No medications prior to arrival.    Past Medical History:  Diagnosis Date  . Chronic back pain   . Hypertension     Patient Active Problem List   Diagnosis Date Noted  . Chest pain 02/11/2016    Past Surgical History:  Procedure Laterality Date  . ABDOMINAL HYSTERECTOMY      Prior to Admission medications   Medication Sig Start Date End Date Taking? Authorizing Provider  aspirin EC 81 MG EC tablet Take 1 tablet (81 mg total) by mouth daily. 02/12/16   Auburn Bilberry, MD  cyclobenzaprine (FLEXERIL) 10 MG tablet Take 1 tablet (10 mg total) by mouth 3 (three) times daily as needed for muscle spasms. 03/16/17   Triplett, Rulon Eisenmenger B, FNP  HYDROcodone-acetaminophen (NORCO/VICODIN) 5-325 MG tablet Take 1 tablet by mouth every 4 (four) hours as needed for moderate pain. 01/16/18   Cuthriell, Delorise Royals, PA-C  meloxicam (MOBIC) 15 MG tablet Take 1 tablet (15 mg total) by mouth daily. 01/16/18   Cuthriell, Delorise Royals, PA-C  methocarbamol (ROBAXIN) 500 MG tablet Take 1 tablet (500 mg  total) by mouth 4 (four) times daily. 11/19/17   Cuthriell, Delorise Royals, PA-C  metoprolol tartrate (LOPRESSOR) 25 MG tablet Take 1 tablet (25 mg total) by mouth 2 (two) times daily. 02/12/16   Auburn Bilberry, MD  predniSONE (DELTASONE) 50 MG tablet Take 1 tablet (50 mg total) by mouth daily with breakfast. 11/27/17   Cuthriell, Delorise Royals, PA-C  vitamin B-12 (CYANOCOBALAMIN) 1000 MCG tablet Take 1,000 mcg by mouth daily.    [provider]    Allergies Compazine [prochlorperazine edisylate]  Family History  Problem Relation Age of Onset  . Heart disease Mother   . Hypertension Mother   . CVA Mother   . Colon cancer Mother   . Cervical cancer Mother     Social History Social History   Tobacco Use  . Smoking status: Never Smoker  . Smokeless tobacco: Never Used  Substance Use Topics  . Alcohol use: No  . Drug use: No     Review of Systems  Constitutional: No fever/chills Eyes: No visual changes.  Cardiovascular: no chest pain. Respiratory: no cough. No SOB. Gastrointestinal: No abdominal pain.  No nausea, no vomiting.  Musculoskeletal: Positive for right knee pain/injury Skin: Negative for rash, abrasions, lacerations, ecchymosis. Neurological: Negative for headaches, focal weakness or numbness. 10-point ROS otherwise negative.  ____________________________________________   PHYSICAL EXAM:  VITAL SIGNS: ED Triage Vitals  Enc Vitals Group     BP 01/16/18 2212 (!) 141/48  Pulse Rate 01/16/18 2212 77     Resp 01/16/18 2212 20     Temp 01/16/18 2212 98.6 F (37 C)     Temp Source 01/16/18 2212 Oral     SpO2 01/16/18 2212 100 %     Weight 01/16/18 2211 243 lb (110.2 kg)     Height 01/16/18 2211 5\' 6"  (1.676 m)     Head Circumference --      Peak Flow --      Pain Score 01/16/18 2211 10     Pain Loc --      Pain Edu? --      Excl. in GC? --      Constitutional: Alert and oriented. Well appearing and in no acute distress. Eyes: Conjunctivae are  normal. PERRL. EOMI. Head: Atraumatic. Neck: No stridor.    Cardiovascular: Normal rate, regular rhythm. Normal S1 and S2.  Good peripheral circulation. Respiratory: Normal respiratory effort without tachypnea or retractions. Lungs CTAB. Good air entry to the bases with no decreased or absent breath sounds. Musculoskeletal: Full range of motion to all extremities. No gross deformities appreciated.  Visualization of the right knee reveals no visible signs of trauma.  No edema, abrasions, lacerations.  Patient is able to flex and extend the affected joint.  Patient is tender to palpation along bilateral joint lines.  No palpable abnormality.  No ballottement.  Varus, valgus, Lachman's, McMurray's is negative.  Dorsalis pedis pulse intact distally.  Sensation intact distally. Neurologic:  Normal speech and language. No gross focal neurologic deficits are appreciated.  Skin:  Skin is warm, dry and intact. No rash noted. Psychiatric: Mood and affect are normal. Speech and behavior are normal. Patient exhibits appropriate insight and judgement.   ____________________________________________   LABS (all labs ordered are listed, but only abnormal results are displayed)  Labs Reviewed - No data to display ____________________________________________  EKG   ____________________________________________  RADIOLOGY I personally viewed and evaluated these images as part of my medical decision making, as well as reviewing the written report by the radiologist.  Dg Knee Complete 4 Views Right  Result Date: 01/16/2018 CLINICAL DATA:  Right knee pain. EXAM: RIGHT KNEE - COMPLETE 4+ VIEW COMPARISON:  Radiographs 11/24/2017 FINDINGS: No fracture or dislocation. Small joint effusion, similar to prior exam. Mild peripheral spurring and quadriceps enthesophyte, unchanged. Unchanged tiny curvilinear osseous density adjacent to the fibular tip. No focal soft tissue abnormality. IMPRESSION: 1. No acute bony  abnormality. 2. Small joint effusion and mild degenerative change, similar to prior exam. Electronically Signed   By: Narda Rutherford M.D.   On: 01/16/2018 22:49    ____________________________________________    PROCEDURES  Procedure(s) performed:    Procedures    Medications - No data to display   ____________________________________________   INITIAL IMPRESSION / ASSESSMENT AND PLAN / ED COURSE  Pertinent labs & imaging results that were available during my care of the patient were reviewed by me and considered in my medical decision making (see chart for details).  Review of the [place] CSRS was performed in accordance of the [facility] prior to dispensing any controlled drugs.      Patient's diagnosis is consistent with knee strain.  Patient presents the emergency department after injury to her right knee this evening.  Patient has a baseline history of osteoarthritis to the knee.  Overall, exam is reassuring.  No laxity on exam.  Patient Artie has an appointment to see orthopedics for osteoarthritis.  X-ray feels no acute osseous abnormality.  Patient may use the splint or crutches at home as needed.  Patient will be prescribed meloxicam and very limited prescription of Vicodin for pain.  Patient is given ED precautions to return to the ED for any worsening or new symptoms.     ____________________________________________  FINAL CLINICAL IMPRESSION(S) / ED DIAGNOSES  Final diagnoses:  Strain of right knee, initial encounter      NEW MEDICATIONS STARTED DURING THIS VISIT:  ED Discharge Orders         Ordered    meloxicam (MOBIC) 15 MG tablet  Daily     01/16/18 2300    HYDROcodone-acetaminophen (NORCO/VICODIN) 5-325 MG tablet  Every 4 hours PRN     01/16/18 2300              This chart was dictated using voice recognition software/Dragon. Despite best efforts to proofread, errors can occur which can change the meaning. Any change was purely unintentional.     Racheal PatchesCuthriell, Jonathan D, PA-C 01/16/18 2301    Dionne BucySiadecki, Sebastian, MD 01/16/18 2315

## 2018-01-16 NOTE — ED Notes (Signed)
XR at bedside

## 2018-01-16 NOTE — ED Notes (Signed)
Pt verbalized understanding of d/c instructions and f/u care, no further questions at this time. Assisted to exit via wheelchair.

## 2018-01-16 NOTE — ED Triage Notes (Signed)
Pt to triage via w/c with no distress noted; pt reports "my body went one way and my knee went the other"; c/o right knee pain since; st hx arthritis

## 2019-01-25 ENCOUNTER — Other Ambulatory Visit: Payer: Self-pay

## 2019-01-25 ENCOUNTER — Emergency Department
Admission: EM | Admit: 2019-01-25 | Discharge: 2019-01-25 | Disposition: A | Discharge status: Home / Self Care | Payer: BC Managed Care – PPO | Attending: Emergency Medicine | Admitting: Emergency Medicine

## 2019-01-25 ENCOUNTER — Emergency Department: Payer: BC Managed Care – PPO

## 2019-01-25 DIAGNOSIS — I10 Essential (primary) hypertension: Secondary | ICD-10-CM | POA: Insufficient documentation

## 2019-01-25 DIAGNOSIS — Z79899 Other long term (current) drug therapy: Secondary | ICD-10-CM | POA: Diagnosis not present

## 2019-01-25 DIAGNOSIS — S161XXA Strain of muscle, fascia and tendon at neck level, initial encounter: Secondary | ICD-10-CM | POA: Insufficient documentation

## 2019-01-25 DIAGNOSIS — Y92511 Restaurant or cafe as the place of occurrence of the external cause: Secondary | ICD-10-CM | POA: Diagnosis not present

## 2019-01-25 DIAGNOSIS — Y999 Unspecified external cause status: Secondary | ICD-10-CM | POA: Diagnosis not present

## 2019-01-25 DIAGNOSIS — Y9389 Activity, other specified: Secondary | ICD-10-CM | POA: Insufficient documentation

## 2019-01-25 DIAGNOSIS — S199XXA Unspecified injury of neck, initial encounter: Secondary | ICD-10-CM | POA: Diagnosis present

## 2019-01-25 DIAGNOSIS — Z7982 Long term (current) use of aspirin: Secondary | ICD-10-CM | POA: Insufficient documentation

## 2019-01-25 MED ORDER — CYCLOBENZAPRINE HCL 10 MG PO TABS
10.0000 mg | ORAL_TABLET | Freq: Once | ORAL | Status: AC
  Administered 2019-01-25: 22:00:00 10 mg via ORAL
  Filled 2019-01-25: qty 1

## 2019-01-25 MED ORDER — IBUPROFEN 800 MG PO TABS
800.0000 mg | ORAL_TABLET | Freq: Once | ORAL | Status: AC
  Administered 2019-01-25: 22:00:00 800 mg via ORAL
  Filled 2019-01-25: qty 1

## 2019-01-25 MED ORDER — CYCLOBENZAPRINE HCL 10 MG PO TABS: 10.0000 mg | ORAL_TABLET | Freq: Three times a day (TID) | ORAL | 0 refills | Status: DC | PRN

## 2019-01-25 MED ORDER — MELOXICAM 15 MG PO TABS: 15.0000 mg | ORAL_TABLET | Freq: Every day | ORAL | 2 refills | Status: AC
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
## 2019-01-25 NOTE — Discharge Instructions (Addendum)
Follow-up with your regular doctor if not better in 5 to 7 days or you may follow-up with critical clinic orthopedics.  Take medication as prescribed.  Do not take the Flexeril if you go to be operating a car or heavy machinery.  This medication will make you drowsy.  You may take it 8 hours prior to driving.
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
## 2019-05-19 ENCOUNTER — Other Ambulatory Visit: Payer: Self-pay

## 2019-05-19 ENCOUNTER — Emergency Department
Admission: EM | Admit: 2019-05-19 | Discharge: 2019-05-19 | Disposition: A | Discharge status: Home / Self Care | Payer: BC Managed Care – PPO | Attending: Emergency Medicine | Admitting: Emergency Medicine

## 2019-05-19 DIAGNOSIS — Z79899 Other long term (current) drug therapy: Secondary | ICD-10-CM | POA: Insufficient documentation

## 2019-05-19 DIAGNOSIS — I10 Essential (primary) hypertension: Secondary | ICD-10-CM | POA: Diagnosis not present

## 2019-05-19 DIAGNOSIS — R112 Nausea with vomiting, unspecified: Secondary | ICD-10-CM | POA: Diagnosis not present

## 2019-05-19 DIAGNOSIS — Z7982 Long term (current) use of aspirin: Secondary | ICD-10-CM | POA: Diagnosis not present

## 2019-05-19 DIAGNOSIS — R197 Diarrhea, unspecified: Secondary | ICD-10-CM | POA: Insufficient documentation

## 2019-05-19 DIAGNOSIS — K529 Noninfective gastroenteritis and colitis, unspecified: Secondary | ICD-10-CM

## 2019-05-19 LAB — URINALYSIS, COMPLETE (UACMP) WITH MICROSCOPIC
Bilirubin Urine: NEGATIVE
Glucose, UA: NEGATIVE mg/dL
Hgb urine dipstick: NEGATIVE
Ketones, ur: 5 mg/dL — AB
Nitrite: NEGATIVE
Protein, ur: NEGATIVE mg/dL
Specific Gravity, Urine: 1.031 — ABNORMAL HIGH (ref 1.005–1.030)
pH: 5 (ref 5.0–8.0)

## 2019-05-19 LAB — COMPREHENSIVE METABOLIC PANEL
ALT: 14 U/L (ref 0–44)
AST: 14 U/L — ABNORMAL LOW (ref 15–41)
Albumin: 4 g/dL (ref 3.5–5.0)
Alkaline Phosphatase: 65 U/L (ref 38–126)
Anion gap: 7 (ref 5–15)
BUN: 16 mg/dL (ref 6–20)
CO2: 27 mmol/L (ref 22–32)
Calcium: 8.9 mg/dL (ref 8.9–10.3)
Chloride: 102 mmol/L (ref 98–111)
Creatinine, Ser: 0.83 mg/dL (ref 0.44–1.00)
GFR calc Af Amer: >60 mL/min (ref >60)
GFR calc non Af Amer: >60 mL/min (ref >60)
Glucose, Bld: 105 mg/dL — ABNORMAL HIGH (ref 70–99)
Potassium: 3.3 mmol/L — ABNORMAL LOW (ref 3.5–5.1)
Sodium: 136 mmol/L (ref 135–145)
Total Bilirubin: 0.7 mg/dL (ref 0.3–1.2)
Total Protein: 8 g/dL (ref 6.5–8.1)

## 2019-05-19 LAB — CBC
HCT: 40 % (ref 36.0–46.0)
Hemoglobin: 12.5 g/dL (ref 12.0–15.0)
MCH: 24.9 pg — ABNORMAL LOW (ref 26.0–34.0)
MCHC: 31.3 g/dL (ref 30.0–36.0)
MCV: 79.5 fL — ABNORMAL LOW (ref 80.0–100.0)
Platelets: 361 10*3/uL (ref 150–400)
RBC: 5.03 MIL/uL (ref 3.87–5.11)
RDW: 13.9 % (ref 11.5–15.5)
WBC: 10.6 10*3/uL — ABNORMAL HIGH (ref 4.0–10.5)
nRBC: 0 % (ref 0.0–0.2)

## 2019-05-19 LAB — POCT PREGNANCY, URINE: Preg Test, Ur: NEGATIVE

## 2019-05-19 LAB — LIPASE, BLOOD: Lipase: 25 U/L (ref 11–51)

## 2019-05-19 MED ORDER — SODIUM CHLORIDE 0.9% FLUSH: 3.0000 mL | Freq: Once | INTRAVENOUS | Status: DC

## 2019-05-19 MED ORDER — ONDANSETRON HCL 4 MG PO TABS: 4.0000 mg | ORAL_TABLET | Freq: Every day | ORAL | 0 refills | Status: DC | PRN

## 2019-05-19 NOTE — ED Triage Notes (Signed)
N&V&D x 3 days. No fever. A&O, ambulatory. No distress noted.

## 2019-05-19 NOTE — ED Provider Notes (Signed)
[facility] Emergency Department Provider Note  Time seen: 1:35 PM  I have reviewed the triage vital signs and the nursing notes.   HISTORY  Chief Complaint Emesis and Diarrhea   HPI Linda Giles is a 54 y.o. female with a past medical history of hypertension, presents to the emergency department for nausea vomiting diarrhea.  According to the patient for the past 3 days she has been experiencing nausea vomiting diarrhea.  States abdominal cramping but denies any focal areas of pain.  Denies any fever.  No dysuria.  Patient states he was at work this morning she began having more nausea vomiting so she came to the emergency department.   Past Medical History:  Diagnosis Date  . Chronic back pain   . Hypertension     Patient Active Problem List   Diagnosis Date Noted  . Chest pain 02/11/2016    Past Surgical History:  Procedure Laterality Date  . ABDOMINAL HYSTERECTOMY      Prior to Admission medications   Medication Sig Start Date End Date Taking? Authorizing Provider  aspirin EC 81 MG EC tablet Take 1 tablet (81 mg total) by mouth daily. 02/12/16   Dustin Flock, MD  cyclobenzaprine (FLEXERIL) 10 MG tablet Take 1 tablet (10 mg total) by mouth 3 (three) times daily as needed. 01/25/19   Fisher, Linden Dolin, PA-C  meloxicam (MOBIC) 15 MG tablet Take 1 tablet (15 mg total) by mouth daily. 01/25/19 01/25/20  Fisher, Linden Dolin, PA-C  metoprolol tartrate (LOPRESSOR) 25 MG tablet Take 1 tablet (25 mg total) by mouth 2 (two) times daily. 02/12/16   Dustin Flock, MD  vitamin B-12 (CYANOCOBALAMIN) 1000 MCG tablet Take 1,000 mcg by mouth daily.    [provider]    Allergies  Allergen Reactions  . Compazine [Prochlorperazine Edisylate] Swelling    Family History  Problem Relation Age of Onset  . Heart disease Mother   . Hypertension Mother   . CVA Mother   . Colon cancer Mother   . Cervical cancer Mother     Social History Social History    Tobacco Use  . Smoking status: Never Smoker  . Smokeless tobacco: Never Used  Substance Use Topics  . Alcohol use: No  . Drug use: No    Review of Systems Constitutional: Negative for fever. Cardiovascular: Negative for chest pain. Respiratory: Negative for shortness of breath. Gastrointestinal: Positive for abdominal cramping.  Positive for nausea vomiting diarrhea. Genitourinary: Negative for urinary compaints Musculoskeletal: Negative for musculoskeletal complaints  Neurological: Negative for headache All other ROS negative  ____________________________________________   PHYSICAL EXAM:  VITAL SIGNS: ED Triage Vitals  Enc Vitals Group     BP 05/19/19 1041 (!) 151/78     Pulse Rate 05/19/19 1041 88     Resp 05/19/19 1041 18     Temp 05/19/19 1041 99.2 F (37.3 C)     Temp Source 05/19/19 1041 Oral     SpO2 05/19/19 1041 97 %     Weight --      Height --      Head Circumference --      Peak Flow --      Pain Score 05/19/19 1315 5     Pain Loc --      Pain Edu? --      Excl. in [place]? --    Constitutional: Alert and oriented. Well appearing and in no distress. Eyes: Normal exam ENT      Head:  Normocephalic and atraumatic.      Mouth/Throat: Mucous membranes are moist. Cardiovascular: Normal rate, regular rhythm.  Respiratory: Normal respiratory effort without tachypnea nor retractions. Breath sounds are clear Gastrointestinal: Soft, slight diffuse tenderness without area of focal tenderness identified.  No rebound guarding or distention. Musculoskeletal: Nontender with normal range of motion in all extremities.  Neurologic:  Normal speech and language. No gross focal neurologic deficits Skin:  Skin is warm, dry and intact.  Psychiatric: Mood and affect are normal.   ____________________________________________   INITIAL IMPRESSION / ASSESSMENT AND PLAN / ED COURSE  Pertinent labs & imaging results that were available during my care of the patient were  reviewed by me and considered in my medical decision making (see chart for details).   Patient presents to the emergency department for nausea vomiting diarrhea, abdominal cramping.  Patient's labs are reassuring, urinalysis reassuring.  Physical exam is reassuring without areas of focal tenderness identified.  Highly suspect gastroenteritis.  Patient works in [employer], I told her she needs to stay at home for the next 3 days.  Supportive care including Imodium, Zofran and fluids.  Patient agreeable to plan of care.  Discussed return precautions.  Linda Giles was evaluated in Emergency Department on 05/19/2019 for the symptoms described in the history of present illness. She was evaluated in the context of the global COVID-19 pandemic, which necessitated consideration that the patient might be at risk for infection with the SARS-CoV-2 virus that causes COVID-19. Institutional protocols and algorithms that pertain to the evaluation of patients at risk for COVID-19 are in a state of rapid change based on information released by regulatory bodies including the CDC and federal and state organizations. These policies and algorithms were followed during the patient's care in the ED.  ____________________________________________   FINAL CLINICAL IMPRESSION(S) / ED DIAGNOSES  Nausea vomiting diarrhea   Minna Antis, MD 05/19/19 1337

## 2019-05-19 NOTE — ED Triage Notes (Signed)
First Nurse Note:  C/O N/V/D x 3 days.  Denies fever.    AAOx3  Skin warm and dry. NAD

## 2019-06-19 ENCOUNTER — Emergency Department: Payer: BC Managed Care – PPO

## 2019-06-19 ENCOUNTER — Other Ambulatory Visit: Payer: Self-pay

## 2019-06-19 ENCOUNTER — Emergency Department
Admission: EM | Admit: 2019-06-19 | Discharge: 2019-06-19 | Disposition: A | Discharge status: Home / Self Care | Payer: BC Managed Care – PPO | Attending: Emergency Medicine | Admitting: Emergency Medicine

## 2019-06-19 DIAGNOSIS — K449 Diaphragmatic hernia without obstruction or gangrene: Secondary | ICD-10-CM

## 2019-06-19 DIAGNOSIS — R42 Dizziness and giddiness: Secondary | ICD-10-CM | POA: Diagnosis not present

## 2019-06-19 DIAGNOSIS — I10 Essential (primary) hypertension: Secondary | ICD-10-CM | POA: Diagnosis not present

## 2019-06-19 DIAGNOSIS — R079 Chest pain, unspecified: Secondary | ICD-10-CM

## 2019-06-19 DIAGNOSIS — R0789 Other chest pain: Secondary | ICD-10-CM | POA: Diagnosis present

## 2019-06-19 LAB — BASIC METABOLIC PANEL
Anion gap: 9 (ref 5–15)
BUN: 14 mg/dL (ref 6–20)
CO2: 30 mmol/L (ref 22–32)
Calcium: 9.4 mg/dL (ref 8.9–10.3)
Chloride: 102 mmol/L (ref 98–111)
Creatinine, Ser: 0.77 mg/dL (ref 0.44–1.00)
GFR calc Af Amer: >60 mL/min (ref >60)
GFR calc non Af Amer: >60 mL/min (ref >60)
Glucose, Bld: 138 mg/dL — ABNORMAL HIGH (ref 70–99)
Potassium: 3.6 mmol/L (ref 3.5–5.1)
Sodium: 141 mmol/L (ref 135–145)

## 2019-06-19 LAB — CBC
HCT: 39 % (ref 36.0–46.0)
Hemoglobin: 12.3 g/dL (ref 12.0–15.0)
MCH: 25.1 pg — ABNORMAL LOW (ref 26.0–34.0)
MCHC: 31.5 g/dL (ref 30.0–36.0)
MCV: 79.6 fL — ABNORMAL LOW (ref 80.0–100.0)
Platelets: 400 10*3/uL (ref 150–400)
RBC: 4.9 MIL/uL (ref 3.87–5.11)
RDW: 13.7 % (ref 11.5–15.5)
WBC: 12.8 10*3/uL — ABNORMAL HIGH (ref 4.0–10.5)
nRBC: 0 % (ref 0.0–0.2)

## 2019-06-19 LAB — TROPONIN I (HIGH SENSITIVITY)
Troponin I (High Sensitivity): 3 ng/L (ref <18)
Troponin I (High Sensitivity): 4 ng/L (ref <18)

## 2019-06-19 LAB — POCT PREGNANCY, URINE: Preg Test, Ur: NEGATIVE

## 2019-06-19 MED ORDER — IOHEXOL 350 MG/ML SOLN
100.0000 mL | Freq: Once | INTRAVENOUS | Status: AC | PRN
  Administered 2019-06-19: 100 mL via INTRAVENOUS

## 2019-06-19 NOTE — ED Triage Notes (Signed)
Pt arrives to ED via POV from home with c/o CP and dizziness. Pt reports dizziness x3 days; CP for the last 1 hr. Pt reports CP is centralized; denies radiation but states "both my arms felt numb". Pt denies SHOB; reports nausea earlier, but none at this time. No recent illness or fevers. Pt is A&O, in NAD; RR even, regular, and unlabored.

## 2019-06-19 NOTE — ED Provider Notes (Signed)
[facility] Emergency Department Provider Note  ____________________________________________   First MD Initiated Contact with Patient 06/19/19 [phone]     (approximate)  I have reviewed the triage vital signs and the nursing notes.   HISTORY  Chief Complaint   HPI Linda Giles is a 54 y.o. female with below list of previous medical conditions presents to the emergency department secondary to intermittent chest pain and dizziness x3 days.  Patient states that current chest pain has been occurring for the past hour with numbness of bilateral arms.  Patient denies any radiation of the pain.  Patient does admit to nausea however none at present.  Patient denies any cough no dyspnea.  Patient denies any fever.  Patient denies any recent illness.  Patient does admit to familial history of heart disease in mother and father.       Past Medical History:  Diagnosis Date  . Chronic back pain   . Hypertension     Patient Active Problem List   Diagnosis Date Noted  . Chest pain 02/11/2016    Past Surgical History:  Procedure Laterality Date  . ABDOMINAL HYSTERECTOMY      Prior to Admission medications   Medication Sig Start Date End Date Taking? Authorizing Provider  aspirin EC 81 MG EC tablet Take 1 tablet (81 mg total) by mouth daily. 02/12/16   Auburn Bilberry, MD  cyclobenzaprine (FLEXERIL) 10 MG tablet Take 1 tablet (10 mg total) by mouth 3 (three) times daily as needed. 01/25/19   Fisher, Roselyn Bering, PA-C  meloxicam (MOBIC) 15 MG tablet Take 1 tablet (15 mg total) by mouth daily. 01/25/19 01/25/20  Fisher, Roselyn Bering, PA-C  metoprolol tartrate (LOPRESSOR) 25 MG tablet Take 1 tablet (25 mg total) by mouth 2 (two) times daily. 02/12/16   Auburn Bilberry, MD  ondansetron (ZOFRAN) 4 MG tablet Take 1 tablet (4 mg total) by mouth daily as needed for nausea or vomiting. 05/19/19   Minna Antis, MD  vitamin B-12 (CYANOCOBALAMIN) 1000 MCG tablet Take 1,000 mcg by  mouth daily.    [provider]    Allergies Compazine [prochlorperazine edisylate]  Family History  Problem Relation Age of Onset  . Heart disease Mother   . Hypertension Mother   . CVA Mother   . Colon cancer Mother   . Cervical cancer Mother     Social History Social History   Tobacco Use  . Smoking status: Never Smoker  . Smokeless tobacco: Never Used  Substance Use Topics  . Alcohol use: No  . Drug use: No    Review of Systems Constitutional: No fever/chills Eyes: No visual changes. ENT: No sore throat. Cardiovascular: Positive for chest pain. Respiratory: Denies shortness of breath. Gastrointestinal: No abdominal pain.  No nausea, no vomiting.  No diarrhea.  No constipation. Genitourinary: Negative for dysuria. Musculoskeletal: Negative for neck pain.  Negative for back pain. Integumentary: Negative for rash. Neurological: Negative for headaches, focal weakness or numbness.  ____________________________________________   PHYSICAL EXAM:  VITAL SIGNS: ED Triage Vitals  Enc Vitals Group     BP 06/19/19 0102 (!) 166/57     Pulse Rate 06/19/19 0102 73     Resp 06/19/19 0102 18     Temp 06/19/19 0102 98.6 F (37 C)     Temp Source 06/19/19 0102 Oral     SpO2 06/19/19 0102 99 %     Weight 06/19/19 0103 111.1 kg (245 lb)     Height 06/19/19 0103 1.676  m (5\' 6" )     Head Circumference --      Peak Flow --      Pain Score 06/19/19 0108 7     Pain Loc --      Pain Edu? --      Excl. in [place]? --     Constitutional: Alert and oriented.  Eyes: Conjunctivae are normal.  Head: Atraumatic. Mouth/Throat: Patient is wearing a mask. Neck: No stridor.  No meningeal signs.   Cardiovascular: Normal rate, regular rhythm. Good peripheral circulation. Grossly normal heart sounds. Respiratory: Normal respiratory effort.  No retractions. Gastrointestinal: Soft and nontender. No distention.  Musculoskeletal: No lower extremity tenderness nor edema. No gross  deformities of extremities. Neurologic:  Normal speech and language. No gross focal neurologic deficits are appreciated.  Skin:  Skin is warm, dry and intact. Psychiatric: Mood and affect are normal. Speech and behavior are normal.  ____________________________________________   LABS (all labs ordered are listed, but only abnormal results are displayed)  Labs Reviewed  BASIC METABOLIC PANEL - Abnormal; Notable for the following components:      Result Value   Glucose, Bld 138 (*)    All other components within normal limits  CBC - Abnormal; Notable for the following components:   WBC 12.8 (*)    MCV 79.6 (*)    MCH 25.1 (*)    All other components within normal limits  POC URINE PREG, ED  POCT PREGNANCY, URINE  TROPONIN I (HIGH SENSITIVITY)  TROPONIN I (HIGH SENSITIVITY)   ____________________________________________  EKG  ED ECG REPORT I, Tolna N Denece Shearer, the attending physician, personally viewed and interpreted this ECG.   Date: 06/19/2019  EKG Time: 1:05 AM  Rate: 73  Rhythm: Normal sinus rhythm  Axis: Normal  Intervals: Normal  ST&T Change: None  ____________________________________________  RADIOLOGY I, Merriam N Cathalina Barcia, personally viewed and evaluated these images (plain radiographs) as part of my medical decision making, as well as reviewing the written report by the radiologist.  ED MD interpretation: Chest x-ray revealed no active cardiopulmonary disease per radiologist.  CT angiogram of the chest revealed no central or proximal large segmental pulmonary emboli.  Moderate paraesophageal hernia noted.  No acute intrathoracic pathology per radiologist.  Official radiology report(s): DG Chest 2 View  Result Date: 06/19/2019 CLINICAL DATA:  Chest pain and dizziness EXAM: CHEST - 2 VIEW COMPARISON:  None. FINDINGS: The heart size and mediastinal contours are within normal limits. Both lungs are clear. The visualized skeletal structures are unremarkable.  IMPRESSION: No active cardiopulmonary disease. Electronically Signed   By: 08/19/2019 M.D.   On: 06/19/2019 01:38   CT Angio Chest PE W and/or Wo Contrast  Result Date: 06/19/2019 CLINICAL DATA:  Shortness of breath EXAM: CT ANGIOGRAPHY CHEST WITH CONTRAST TECHNIQUE: Multidetector CT imaging of the chest was performed using the standard protocol during bolus administration of intravenous contrast. Multiplanar CT image reconstructions and MIPs were obtained to evaluate the vascular anatomy. CONTRAST:  08/19/2019 OMNIPAQUE IOHEXOL 350 MG/ML SOLN COMPARISON:  None. FINDINGS: Cardiovascular: There is suboptimal opacification of the main pulmonary artery. However there is no main central or large proximal segmental pulmonary embolism. There is mild cardiomegaly. No pericardial effusion or thickening. No evidence right heart strain. There is normal three-vessel brachiocephalic anatomy without proximal stenosis. The thoracic aorta is normal in appearance. Mediastinum/Nodes: No hilar, mediastinal, or axillary adenopathy. Thyroid gland, trachea, and esophagus demonstrate no significant findings. Lungs/Pleura: The lungs are clear. No pleural effusion or pneumothorax. No  airspace consolidation. Upper Abdomen: A moderate paraesophageal hernia is present. Musculoskeletal: No chest wall abnormality. No acute or significant osseous findings. Review of the MIP images confirms the above findings. IMPRESSION: Suboptimal opacification of the main pulmonary artery, however no central or proximal large segmental pulmonary embolism. Moderate paraesophageal hernia. No other acute intrathoracic pathology to explain the patient's symptoms. Electronically Signed   By: Prudencio Pair M.D.   On: 06/19/2019 06:32      Procedures   ____________________________________________   INITIAL IMPRESSION / MDM / ASSESSMENT AND PLAN / ED COURSE  As part of my medical decision making, I reviewed the following data within the electronic medical  record:    55 year old female presented with above-stated history and physical exam a differential diagnosis including but not limited to ACS, pulmonary emboli, pleurisy, hiatal hernia.  EKG revealed no evidence of ischemia or infarction laboratory data including high-sensitivity troponin x2 negative.  Chest x-ray revealed no active cardiopulmonary disease per radiologist.  CT chest revealed no evidence of pulmonary emboli or any other acute intrathoracic pathology however CT chest did revealed a moderate paraesophageal hernia.  I informed the patient of all clinical findings including the paraesophageal hernia.  Patient will also be referred to outpatient cardiology for further outpatient evaluation  ____________________________________________  FINAL CLINICAL IMPRESSION(S) / ED DIAGNOSES  Final diagnoses:  Paraesophageal hernia  Chest pain, unspecified type     MEDICATIONS GIVEN DURING THIS VISIT:  Medications  iohexol (OMNIPAQUE) 350 MG/ML injection 100 mL (100 mLs Intravenous Contrast Given 06/19/19 0548)     ED Discharge Orders    None      *Please note:  MYRENE BOUGHER was evaluated in Emergency Department on 06/19/2019 for the symptoms described in the history of present illness. She was evaluated in the context of the global COVID-19 pandemic, which necessitated consideration that the patient might be at risk for infection with the SARS-CoV-2 virus that causes COVID-19. Institutional protocols and algorithms that pertain to the evaluation of patients at risk for COVID-19 are in a state of rapid change based on information released by regulatory bodies including the CDC and federal and state organizations. These policies and algorithms were followed during the patient's care in the ED.  Some ED evaluations and interventions may be delayed as a result of limited staffing during the pandemic.*  Note:  This document was prepared using Dragon voice recognition software and may include  unintentional dictation errors.   Gregor Hams, MD 06/24/19 [phone]

## 2020-01-28 ENCOUNTER — Other Ambulatory Visit: Payer: Self-pay

## 2020-01-28 DIAGNOSIS — I10 Essential (primary) hypertension: Secondary | ICD-10-CM | POA: Insufficient documentation

## 2020-01-28 DIAGNOSIS — Z7982 Long term (current) use of aspirin: Secondary | ICD-10-CM | POA: Insufficient documentation

## 2020-01-28 DIAGNOSIS — R55 Syncope and collapse: Secondary | ICD-10-CM | POA: Diagnosis present

## 2020-01-28 DIAGNOSIS — Z79899 Other long term (current) drug therapy: Secondary | ICD-10-CM | POA: Insufficient documentation

## 2020-01-28 LAB — BASIC METABOLIC PANEL
Anion gap: 11 (ref 5–15)
BUN: 14 mg/dL (ref 6–20)
CO2: 28 mmol/L (ref 22–32)
Calcium: 9 mg/dL (ref 8.9–10.3)
Chloride: 103 mmol/L (ref 98–111)
Creatinine, Ser: 0.78 mg/dL (ref 0.44–1.00)
GFR, Estimated: >60 mL/min (ref >60)
Glucose, Bld: 112 mg/dL — ABNORMAL HIGH (ref 70–99)
Potassium: 3.6 mmol/L (ref 3.5–5.1)
Sodium: 142 mmol/L (ref 135–145)

## 2020-01-28 LAB — CBC
HCT: 37.7 % (ref 36.0–46.0)
Hemoglobin: 11.6 g/dL — ABNORMAL LOW (ref 12.0–15.0)
MCH: 24.7 pg — ABNORMAL LOW (ref 26.0–34.0)
MCHC: 30.8 g/dL (ref 30.0–36.0)
MCV: 80.4 fL (ref 80.0–100.0)
Platelets: 405 10*3/uL — ABNORMAL HIGH (ref 150–400)
RBC: 4.69 MIL/uL (ref 3.87–5.11)
RDW: 13.8 % (ref 11.5–15.5)
WBC: 8.8 10*3/uL (ref 4.0–10.5)
nRBC: 0 % (ref 0.0–0.2)

## 2020-01-28 LAB — TROPONIN I (HIGH SENSITIVITY)
Troponin I (High Sensitivity): 7 ng/L (ref <18)
Troponin I (High Sensitivity): 5 ng/L (ref <18)

## 2020-01-28 LAB — CBG MONITORING, ED: Glucose-Capillary: 100 mg/dL — ABNORMAL HIGH (ref 70–99)
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
## 2020-01-29 ENCOUNTER — Emergency Department
Admission: EM | Admit: 2020-01-29 | Discharge: 2020-01-29 | Disposition: A | Discharge status: Home / Self Care | Payer: BC Managed Care – PPO | Attending: Emergency Medicine | Admitting: Emergency Medicine

## 2020-01-29 DIAGNOSIS — R42 Dizziness and giddiness: Secondary | ICD-10-CM

## 2020-01-29 DIAGNOSIS — R55 Syncope and collapse: Secondary | ICD-10-CM

## 2020-01-29 NOTE — ED Provider Notes (Signed)
[facility] Emergency Department Provider Note   ____________________________________________   Event Date/Time   First MD Initiated Contact with Patient 01/29/20 0209     (approximate)  I have reviewed the triage vital signs and the nursing notes.   HISTORY  Chief Complaint Loss of Consciousness    HPI Linda Giles is a 55 y.o. female with a stated past medical history of hypertension and family history of atrial fibrillation who presents for an episode of syncope that occurred at work today.  Patient states that prior to losing consciousness she "felt hot and dizzy like I was going to pass out and the room started spinning".  Patient states that she woke up within a few seconds and immediately returned to baseline.  Patient denies any preceding shortness of breath, chest pain, palpitations, blurry vision, tinnitus, or weakness/numbness/paresthesias in any extremity.  Patient states that she had similar symptoms in the past but has never had syncope.         Past Medical History:  Diagnosis Date  . Chronic back pain   . Hypertension     Patient Active Problem List   Diagnosis Date Noted  . Chest pain 02/11/2016    Past Surgical History:  Procedure Laterality Date  . ABDOMINAL HYSTERECTOMY      Prior to Admission medications   Medication Sig Start Date End Date Taking? Authorizing Provider  aspirin EC 81 MG EC tablet Take 1 tablet (81 mg total) by mouth daily. 02/12/16   Auburn Bilberry, MD  cyclobenzaprine (FLEXERIL) 10 MG tablet Take 1 tablet (10 mg total) by mouth 3 (three) times daily as needed. 01/25/19   Fisher, Roselyn Bering, PA-C  metoprolol tartrate (LOPRESSOR) 25 MG tablet Take 1 tablet (25 mg total) by mouth 2 (two) times daily. 02/12/16   Auburn Bilberry, MD  ondansetron (ZOFRAN) 4 MG tablet Take 1 tablet (4 mg total) by mouth daily as needed for nausea or vomiting. 05/19/19   Minna Antis, MD  vitamin B-12 (CYANOCOBALAMIN) 1000 MCG  tablet Take 1,000 mcg by mouth daily.    [provider]    Allergies Compazine [prochlorperazine edisylate]  Family History  Problem Relation Age of Onset  . Heart disease Mother   . Hypertension Mother   . CVA Mother   . Colon cancer Mother   . Cervical cancer Mother     Social History Social History   Tobacco Use  . Smoking status: Never Smoker  . Smokeless tobacco: Never Used  Substance Use Topics  . Alcohol use: No  . Drug use: No    Review of Systems Constitutional: No fever/chills Eyes: No visual changes. ENT: No sore throat. Cardiovascular: Denies chest pain. Respiratory: Denies shortness of breath. Gastrointestinal: No abdominal pain.  No nausea, no vomiting.  No diarrhea. Genitourinary: Negative for dysuria. Musculoskeletal: Negative for acute arthralgias Skin: Negative for rash. Neurological: Negative for headaches, weakness/numbness/paresthesias in any extremity Psychiatric: Negative for suicidal ideation/homicidal ideation   ____________________________________________   PHYSICAL EXAM:  VITAL SIGNS: ED Triage Vitals  Enc Vitals Group     BP 01/28/20 1912 (!) 160/75     Pulse Rate 01/28/20 1912 67     Resp 01/28/20 1912 18     Temp 01/28/20 1912 98.8 F (37.1 C)     Temp Source 01/28/20 1912 Oral     SpO2 01/28/20 1912 100 %     Weight 01/28/20 1910 250 lb (113.4 kg)     Height 01/28/20 1910 5\' 6"  (  1.676 m)     Head Circumference --      Peak Flow --      Pain Score 01/28/20 1910 0     Pain Loc --      Pain Edu? --      Excl. in GC? --    Constitutional: Alert and oriented. Well appearing and in no acute distress. Eyes: Conjunctivae are normal. PERRL. Head: Atraumatic. Nose: No congestion/rhinnorhea. Mouth/Throat: Mucous membranes are moist. Neck: No stridor Cardiovascular: Grossly normal heart sounds.  Good peripheral circulation. Respiratory: Normal respiratory effort.  No retractions. Gastrointestinal: Soft and nontender.  No distention. Musculoskeletal: No obvious deformities Neurologic:  Normal speech and language. No gross focal neurologic deficits are appreciated. Skin:  Skin is warm and dry. No rash noted. Psychiatric: Mood and affect are normal. Speech and behavior are normal.  ____________________________________________   LABS (all labs ordered are listed, but only abnormal results are displayed)  Labs Reviewed  BASIC METABOLIC PANEL - Abnormal; Notable for the following components:      Result Value   Glucose, Bld 112 (*)    All other components within normal limits  CBC - Abnormal; Notable for the following components:   Hemoglobin 11.6 (*)    MCH 24.7 (*)    Platelets 405 (*)    All other components within normal limits  CBG MONITORING, ED - Abnormal; Notable for the following components:   Glucose-Capillary 100 (*)    All other components within normal limits  URINALYSIS, COMPLETE (UACMP) WITH MICROSCOPIC  POC URINE PREG, ED  TROPONIN I (HIGH SENSITIVITY)  TROPONIN I (HIGH SENSITIVITY)   ____________________________________________  EKG  ED ECG REPORT I, Merwyn Katos, the attending physician, personally viewed and interpreted this ECG.  Date: 01/28/2020 EKG Time: 1909 Rate: 65 Rhythm: normal sinus rhythm QRS Axis: normal Intervals: normal ST/T Wave abnormalities: normal Narrative Interpretation: no evidence of acute ischemia   PROCEDURES  Procedure(s) performed (including Critical Care):  .1-3 Lead EKG Interpretation Performed by: Merwyn Katos, MD Authorized by: Merwyn Katos, MD     Interpretation: normal     ECG rate:  67   ECG rate assessment: normal     Rhythm: sinus rhythm     Ectopy: none     Conduction: normal       ____________________________________________   INITIAL IMPRESSION / ASSESSMENT AND PLAN / ED COURSE  As part of my medical decision making, I reviewed the following data within the electronic MEDICAL RECORD NUMBER Nursing notes  reviewed and incorporated, Labs reviewed, EKG interpreted, Old chart reviewed, Radiograph reviewed and Notes from prior ED visits reviewed and incorporated        Patient presents with complaints of syncope/presyncope ED Workup:  CBC, BMP, Troponin, BNP, ECG Differential diagnosis includes HF, ICH, seizure, stroke, HOCM, ACS, aortic dissection, malignant arrhythmia, or GI bleed. Findings: No evidence of acute laboratory abnormalities.  Troponin negative x1 EKG: No e/o STEMI. No evidence of Brugadas sign, delta wave, epsilon wave, significantly prolonged QTc, or malignant arrhythmia.  Disposition: Discharge. Patient is at baseline at this time. Return precautions expressed and understood in person. Advised follow up with primary care provider or clinic physician in next 24 hours.      ____________________________________________   FINAL CLINICAL IMPRESSION(S) / ED DIAGNOSES  Final diagnoses:  Syncope and collapse  Dizziness     ED Discharge Orders    None       Note:  This document was prepared using Dragon voice  recognition software and may include unintentional dictation errors.   Merwyn Katos, MD 01/29/20 [phone]

## 2020-04-18 ENCOUNTER — Other Ambulatory Visit: Payer: Self-pay

## 2020-04-18 ENCOUNTER — Emergency Department: Payer: BC Managed Care – PPO

## 2020-04-18 ENCOUNTER — Observation Stay
Admission: EM | Admit: 2020-04-18 | Discharge: 2020-04-20 | Disposition: A | Discharge status: Home / Self Care | Payer: BC Managed Care – PPO | Attending: Emergency Medicine | Admitting: Emergency Medicine

## 2020-04-18 DIAGNOSIS — Z79899 Other long term (current) drug therapy: Secondary | ICD-10-CM | POA: Insufficient documentation

## 2020-04-18 DIAGNOSIS — Z20822 Contact with and (suspected) exposure to covid-19: Secondary | ICD-10-CM | POA: Insufficient documentation

## 2020-04-18 DIAGNOSIS — Z7982 Long term (current) use of aspirin: Secondary | ICD-10-CM | POA: Diagnosis not present

## 2020-04-18 DIAGNOSIS — I10 Essential (primary) hypertension: Secondary | ICD-10-CM | POA: Insufficient documentation

## 2020-04-18 DIAGNOSIS — R112 Nausea with vomiting, unspecified: Secondary | ICD-10-CM

## 2020-04-18 DIAGNOSIS — K819 Cholecystitis, unspecified: Secondary | ICD-10-CM

## 2020-04-18 DIAGNOSIS — R1011 Right upper quadrant pain: Secondary | ICD-10-CM | POA: Diagnosis present

## 2020-04-18 DIAGNOSIS — K8012 Calculus of gallbladder with acute and chronic cholecystitis without obstruction: Principal | ICD-10-CM | POA: Insufficient documentation

## 2020-04-18 DIAGNOSIS — K8000 Calculus of gallbladder with acute cholecystitis without obstruction: Secondary | ICD-10-CM | POA: Diagnosis present

## 2020-04-18 LAB — URINALYSIS, COMPLETE (UACMP) WITH MICROSCOPIC
Bacteria, UA: NONE SEEN
Bilirubin Urine: NEGATIVE
Glucose, UA: NEGATIVE mg/dL
Ketones, ur: NEGATIVE mg/dL
Nitrite: NEGATIVE
Protein, ur: NEGATIVE mg/dL
Specific Gravity, Urine: 1.025 (ref 1.005–1.030)
pH: 5 (ref 5.0–8.0)

## 2020-04-18 LAB — COMPREHENSIVE METABOLIC PANEL
ALT: 14 U/L (ref 0–44)
AST: 14 U/L — ABNORMAL LOW (ref 15–41)
Albumin: 4 g/dL (ref 3.5–5.0)
Alkaline Phosphatase: 59 U/L (ref 38–126)
Anion gap: 8 (ref 5–15)
BUN: 13 mg/dL (ref 6–20)
CO2: 28 mmol/L (ref 22–32)
Calcium: 9.1 mg/dL (ref 8.9–10.3)
Chloride: 102 mmol/L (ref 98–111)
Creatinine, Ser: 0.87 mg/dL (ref 0.44–1.00)
GFR, Estimated: >60 mL/min (ref >60)
Glucose, Bld: 113 mg/dL — ABNORMAL HIGH (ref 70–99)
Potassium: 3.7 mmol/L (ref 3.5–5.1)
Sodium: 138 mmol/L (ref 135–145)
Total Bilirubin: 0.5 mg/dL (ref 0.3–1.2)
Total Protein: 8 g/dL (ref 6.5–8.1)

## 2020-04-18 LAB — RESP PANEL BY RT-PCR (FLU A&B, COVID) ARPGX2
Influenza A by PCR: NEGATIVE
Influenza B by PCR: NEGATIVE
SARS Coronavirus 2 by RT PCR: NEGATIVE

## 2020-04-18 LAB — CBC
HCT: 38.5 % (ref 36.0–46.0)
Hemoglobin: 11.8 g/dL — ABNORMAL LOW (ref 12.0–15.0)
MCH: 24.6 pg — ABNORMAL LOW (ref 26.0–34.0)
MCHC: 30.6 g/dL (ref 30.0–36.0)
MCV: 80.4 fL (ref 80.0–100.0)
Platelets: 415 10*3/uL — ABNORMAL HIGH (ref 150–400)
RBC: 4.79 MIL/uL (ref 3.87–5.11)
RDW: 13.7 % (ref 11.5–15.5)
WBC: 10.5 10*3/uL (ref 4.0–10.5)
nRBC: 0 % (ref 0.0–0.2)

## 2020-04-18 LAB — TROPONIN I (HIGH SENSITIVITY): Troponin I (High Sensitivity): 4 ng/L (ref <18)

## 2020-04-18 LAB — LIPASE, BLOOD: Lipase: 33 U/L (ref 11–51)

## 2020-04-18 MED ORDER — ONDANSETRON HCL 4 MG/2ML IJ SOLN
4.0000 mg | Freq: Once | INTRAMUSCULAR | Status: AC
  Administered 2020-04-18: 4 mg via INTRAVENOUS
  Filled 2020-04-18: qty 2

## 2020-04-18 MED ORDER — HYDROMORPHONE HCL 1 MG/ML IJ SOLN
0.5000 mg | Freq: Once | INTRAMUSCULAR | Status: AC
  Administered 2020-04-18: 0.5 mg via INTRAVENOUS
  Filled 2020-04-18: qty 1

## 2020-04-18 MED ORDER — IOHEXOL 300 MG/ML  SOLN
100.0000 mL | Freq: Once | INTRAMUSCULAR | Status: AC | PRN
  Administered 2020-04-18: 100 mL via INTRAVENOUS

## 2020-04-18 MED ORDER — SODIUM CHLORIDE 0.9 % IV BOLUS
1000.0000 mL | Freq: Once | INTRAVENOUS | Status: AC
  Administered 2020-04-18: 1000 mL via INTRAVENOUS

## 2020-04-18 MED ORDER — PANTOPRAZOLE SODIUM 40 MG IV SOLR
40.0000 mg | Freq: Once | INTRAVENOUS | Status: AC
  Administered 2020-04-19: 40 mg via INTRAVENOUS
  Filled 2020-04-18: qty 40

## 2020-04-18 NOTE — ED Provider Notes (Signed)
[facility] Emergency Department Provider Note  ____________________________________________   Event Date/Time   First MD Initiated Contact with Patient 04/18/20 1948     (approximate)  I have reviewed the triage vital signs and the nursing notes.   HISTORY  Chief Complaint Vomiting and Diarrhea    HPI Linda Giles is a 55 y.o. female with hypertension who comes in with nausea, vomiting, diarrhea.  Patient reports that since yesterday she has had severe lower abdominal pain that is constant, nothing makes better, nothing makes it worse.  She reports having multiple episodes of vomiting and diarrhea not able to keep anything down.  Patient has had food poisoning before but states that this is much worse than normal.  She states that she also felt some indigestion had some pain up into her chest but really does not have any at this time.  Denies any shortness of breath.  Patient had a prior hysterectomy.  She denies any risk factors for C. difficile.          Past Medical History:  Diagnosis Date  . Chronic back pain   . Hypertension     Patient Active Problem List   Diagnosis Date Noted  . Chest pain 02/11/2016    Past Surgical History:  Procedure Laterality Date  . ABDOMINAL HYSTERECTOMY      Prior to Admission medications   Medication Sig Start Date End Date Taking? Authorizing Provider  aspirin EC 81 MG EC tablet Take 1 tablet (81 mg total) by mouth daily. 02/12/16   Auburn Bilberry, MD  cyclobenzaprine (FLEXERIL) 10 MG tablet Take 1 tablet (10 mg total) by mouth 3 (three) times daily as needed. 01/25/19   Fisher, Roselyn Bering, PA-C  metoprolol tartrate (LOPRESSOR) 25 MG tablet Take 1 tablet (25 mg total) by mouth 2 (two) times daily. 02/12/16   Auburn Bilberry, MD  ondansetron (ZOFRAN) 4 MG tablet Take 1 tablet (4 mg total) by mouth daily as needed for nausea or vomiting. 05/19/19   Minna Antis, MD  vitamin B-12 (CYANOCOBALAMIN) 1000 MCG  tablet Take 1,000 mcg by mouth daily.    [provider]    Allergies Compazine [prochlorperazine edisylate]  Family History  Problem Relation Age of Onset  . Heart disease Mother   . Hypertension Mother   . CVA Mother   . Colon cancer Mother   . Cervical cancer Mother     Social History Social History   Tobacco Use  . Smoking status: Never Smoker  . Smokeless tobacco: Never Used  Substance Use Topics  . Alcohol use: No  . Drug use: No      Review of Systems Constitutional: No fever/chills Eyes: No visual changes. ENT: No sore throat. Cardiovascular: Denies chest pain. Respiratory: Denies shortness of breath. Gastrointestinal: Positive abdominal pain, nausea, vomiting, diarrhea Genitourinary: Negative for dysuria. Musculoskeletal: Negative for back pain. Skin: Negative for rash. Neurological: Negative for headaches, focal weakness or numbness. All other ROS negative ____________________________________________   PHYSICAL EXAM:  VITAL SIGNS: ED Triage Vitals  Enc Vitals Group     BP 04/18/20 1913 (!) 166/91     Pulse Rate 04/18/20 1910 (!) 59     Resp 04/18/20 1910 20     Temp 04/18/20 1910 98.6 F (37 C)     Temp Source 04/18/20 1910 Oral     SpO2 04/18/20 1910 97 %     Weight 04/18/20 1911 225 lb (102.1 kg)     Height 04/18/20 1911  5\' 8"  (1.727 m)     Head Circumference --      Peak Flow --      Pain Score 04/18/20 1911 10     Pain Loc --      Pain Edu? --      Excl. in GC? --     Constitutional: Alert and oriented. Well appearing and in no acute distress. Eyes: Conjunctivae are normal. EOMI. Head: Atraumatic. Nose: No congestion/rhinnorhea. Mouth/Throat: Mucous membranes are moist.   Neck: No stridor. Trachea Midline. FROM Cardiovascular: Normal rate, regular rhythm. Grossly normal heart sounds.  Good peripheral circulation. Respiratory: Normal respiratory effort.  No retractions. Lungs CTAB. Gastrointestinal: Tenderness in the lower  abdomen. No distention. No abdominal bruits.  Musculoskeletal: No lower extremity tenderness nor edema.  No joint effusions. Neurologic:  Normal speech and language. No gross focal neurologic deficits are appreciated.  Skin:  Skin is warm, dry and intact. No rash noted. Psychiatric: Mood and affect are normal. Speech and behavior are normal. GU: Deferred   ____________________________________________   LABS (all labs ordered are listed, but only abnormal results are displayed)  Labs Reviewed  CBC - Abnormal; Notable for the following components:      Result Value   Hemoglobin 11.8 (*)    MCH 24.6 (*)    Platelets 415 (*)    All other components within normal limits  COMPREHENSIVE METABOLIC PANEL - Abnormal; Notable for the following components:   Glucose, Bld 113 (*)    AST 14 (*)    All other components within normal limits  URINALYSIS, COMPLETE (UACMP) WITH MICROSCOPIC - Abnormal; Notable for the following components:   Color, Urine YELLOW (*)    APPearance HAZY (*)    Hgb urine dipstick MODERATE (*)    Leukocytes,Ua SMALL (*)    All other components within normal limits  RESP PANEL BY RT-PCR (FLU A&B, COVID) ARPGX2  LIPASE, BLOOD  TROPONIN I (HIGH SENSITIVITY)   ____________________________________________   ED ECG REPORT I, 06/18/20, the attending physician, personally viewed and interpreted this ECG.  Normal sinus rate of 63, no ST elevation, no T wave inversions, normal intervals ____________________________________________  RADIOLOGY   Official radiology report(s): CT ABDOMEN PELVIS W CONTRAST  Result Date: 04/18/2020 CLINICAL DATA:  55 year old female with abdominal distension. EXAM: CT ABDOMEN AND PELVIS WITH CONTRAST TECHNIQUE: Multidetector CT imaging of the abdomen and pelvis was performed using the standard protocol following bolus administration of intravenous contrast. CONTRAST:  44 OMNIPAQUE IOHEXOL 300 MG/ML  SOLN COMPARISON:  CT abdomen pelvis  dated 02/11/2016. FINDINGS: Lower chest: The visualized lung bases are clear. No intra-abdominal free air or free fluid. Hepatobiliary: The liver is unremarkable. No intrahepatic biliary dilatation. Noncalcified stones noted within the gallbladder. There is mild gallbladder wall thickening or trace pericholecystic fluid. Further evaluation with ultrasound is recommended to exclude acute cholecystitis. Pancreas: Unremarkable. No pancreatic ductal dilatation or surrounding inflammatory changes. Spleen: Normal in size without focal abnormality. Adrenals/Urinary Tract: The adrenal glands unremarkable. There is no hydronephrosis on either side. There is symmetric enhancement and excretion of contrast by both kidneys. The visualized ureters and urinary bladder appear unremarkable. Stomach/Bowel: Moderate size hiatal hernia. There is no bowel obstruction or active inflammation. The appendix is normal. Vascular/Lymphatic: Mild aortoiliac atherosclerotic disease. The IVC is unremarkable. No portal venous gas. There is no adenopathy. Reproductive: Hysterectomy.  No adnexal masses. Other: Small fat containing umbilical hernia. Musculoskeletal: No acute or significant osseous findings. IMPRESSION: 1. Cholelithiasis with findings concerning for  acute cholecystitis. Further evaluation with ultrasound is recommended. 2. Moderate size hiatal hernia. No bowel obstruction. Normal appendix. 3. Aortic Atherosclerosis (ICD10-I70.0). Electronically Signed   By: Elgie Collard M.D.   On: 04/18/2020 22:13    ____________________________________________   PROCEDURES  Procedure(s) performed (including Critical Care):  .1-3 Lead EKG Interpretation Performed by: Concha Se, MD Authorized by: Concha Se, MD     Interpretation: normal     ECG rate:  60s    ECG rate assessment: normal     Rhythm: sinus bradycardia     Ectopy: none     Conduction: normal        ____________________________________________   INITIAL IMPRESSION / ASSESSMENT AND PLAN / ED COURSE  JAHNAI SLINGERLAND was evaluated in Emergency Department on 04/18/2020 for the symptoms described in the history of present illness. She was evaluated in the context of the global COVID-19 pandemic, which necessitated consideration that the patient might be at risk for infection with the SARS-CoV-2 virus that causes COVID-19. Institutional protocols and algorithms that pertain to the evaluation of patients at risk for COVID-19 are in a state of rapid change based on information released by regulatory bodies including the CDC and federal and state organizations. These policies and algorithms were followed during the patient's care in the ED.    Patient is a 55 year old who comes in with nausea vomiting diarrhea and lower abdominal pain.  Patient is tender in the lower abdomen and she states that is severe, worse than having a child.  Will get CT imaging to rule out diverticulitis, abscess, perforation, obstruction, appendicitis get labs to evaluate for Electra abnormalities, AKI.  Given she reports some indigestion we will also get EKG and cardiac marker to make sure no evidence of ACS.  We will keep patient on the cardiac monitor and give some IV fluids, IV Dilaudid and IV Zofran to help with symptoms.   Patient's labs are reassuring however CT scan is concerning for the possibility of cholecystitis.  They recommended getting an ultrasound to further evaluate.  On reevaluation patient states that her pain and nausea is well controlled and declines any further treatments for this.  She is updated on the need of getting an ultrasound and expressed understanding.  Patient be handed off to oncoming team pending ultrasound and reevaluation    ____________________________________________   FINAL CLINICAL IMPRESSION(S) / ED DIAGNOSES   Final diagnoses:  Nausea & vomiting      MEDICATIONS GIVEN  DURING THIS VISIT:  Medications  sodium chloride 0.9 % bolus 1,000 mL (1,000 mLs Intravenous New Bag/Given 04/18/20 2038)  ondansetron (ZOFRAN) injection 4 mg (4 mg Intravenous Given 04/18/20 2023)  HYDROmorphone (DILAUDID) injection 0.5 mg (0.5 mg Intravenous Given 04/18/20 2024)  iohexol (OMNIPAQUE) 300 MG/ML solution 100 mL (100 mLs Intravenous Contrast Given 04/18/20 2154)     ED Discharge Orders    None       Note:  This document was prepared using Dragon voice recognition software and may include unintentional dictation errors.   Concha Se, MD 04/18/20 2251

## 2020-04-18 NOTE — ED Provider Notes (Signed)
-----------------------------------------   11:19 PM on 04/18/2020 -----------------------------------------  Assuming care from Dr. Fuller Plan.  In short, Linda Giles is a 55 y.o. female with a chief complaint of abdominal pain, N/V/D.  Refer to the original H&P for additional details.  The current plan of care is to follow up ultrasound.   ----------------------------------------- 1:20 AM on 04/19/2020 -----------------------------------------  Ultrasound suggestive of cholecystitis based on gallbladder wall thickening and a stone stuck in the neck of the gallbladder.  I reassessed the patient who states she is still dry heaving.  She said that her pain is little bit better but when I laid her back and palpated her abdomen she still has a positive Murphy sign with severe tenderness to palpation of the right upper quadrant.  I spoke by phone with Dr. Lady Gary with general surgery and she will admit the patient.   Loleta Rose, MD 04/19/20 0120

## 2020-04-18 NOTE — ED Triage Notes (Signed)
Pt In with co n.v.d since yesterday, states also co generalized abd pain.

## 2020-04-19 ENCOUNTER — Observation Stay: Payer: BC Managed Care – PPO | Admitting: Certified Registered"

## 2020-04-19 ENCOUNTER — Encounter: Payer: Self-pay | Admitting: General Surgery

## 2020-04-19 ENCOUNTER — Encounter
Admission: EM | Disposition: A | Discharge status: Home / Self Care | Payer: Self-pay | Source: Home / Self Care | Attending: Emergency Medicine

## 2020-04-19 DIAGNOSIS — R1011 Right upper quadrant pain: Secondary | ICD-10-CM

## 2020-04-19 DIAGNOSIS — K8010 Calculus of gallbladder with chronic cholecystitis without obstruction: Secondary | ICD-10-CM

## 2020-04-19 DIAGNOSIS — K8000 Calculus of gallbladder with acute cholecystitis without obstruction: Secondary | ICD-10-CM | POA: Diagnosis present

## 2020-04-19 HISTORY — PX: LAPAROSCOPIC CHOLECYSTECTOMY: SUR755

## 2020-04-19 LAB — CBC
HCT: 39 % (ref 36.0–46.0)
Hemoglobin: 12.1 g/dL (ref 12.0–15.0)
MCH: 24.8 pg — ABNORMAL LOW (ref 26.0–34.0)
MCHC: 31 g/dL (ref 30.0–36.0)
MCV: 79.9 fL — ABNORMAL LOW (ref 80.0–100.0)
Platelets: 396 10*3/uL (ref 150–400)
RBC: 4.88 MIL/uL (ref 3.87–5.11)
RDW: 13.9 % (ref 11.5–15.5)
WBC: 9.8 10*3/uL (ref 4.0–10.5)
nRBC: 0 % (ref 0.0–0.2)

## 2020-04-19 LAB — COMPREHENSIVE METABOLIC PANEL
ALT: 13 U/L (ref 0–44)
AST: 15 U/L (ref 15–41)
Albumin: 3.9 g/dL (ref 3.5–5.0)
Alkaline Phosphatase: 57 U/L (ref 38–126)
Anion gap: 8 (ref 5–15)
BUN: 9 mg/dL (ref 6–20)
CO2: 26 mmol/L (ref 22–32)
Calcium: 9.2 mg/dL (ref 8.9–10.3)
Chloride: 103 mmol/L (ref 98–111)
Creatinine, Ser: 0.77 mg/dL (ref 0.44–1.00)
GFR, Estimated: >60 mL/min (ref >60)
Glucose, Bld: 114 mg/dL — ABNORMAL HIGH (ref 70–99)
Potassium: 3.6 mmol/L (ref 3.5–5.1)
Sodium: 137 mmol/L (ref 135–145)
Total Bilirubin: 0.5 mg/dL (ref 0.3–1.2)
Total Protein: 8.1 g/dL (ref 6.5–8.1)

## 2020-04-19 LAB — PHOSPHORUS: Phosphorus: 3.8 mg/dL (ref 2.5–4.6)

## 2020-04-19 LAB — HIV ANTIBODY (ROUTINE TESTING W REFLEX): HIV Screen 4th Generation wRfx: NONREACTIVE

## 2020-04-19 LAB — GLUCOSE, CAPILLARY: Glucose-Capillary: 103 mg/dL — ABNORMAL HIGH (ref 70–99)

## 2020-04-19 LAB — MAGNESIUM: Magnesium: 2.1 mg/dL (ref 1.7–2.4)

## 2020-04-19 SURGERY — CHOLECYSTECTOMY, ROBOT-ASSISTED, LAPAROSCOPIC
Anesthesia: General

## 2020-04-19 MED ORDER — SODIUM CHLORIDE 0.9 % IV SOLN
2.0000 g | INTRAVENOUS | Status: DC
  Administered 2020-04-19: 2 g via INTRAVENOUS
  Filled 2020-04-19 (×2): qty 20

## 2020-04-19 MED ORDER — ACETAMINOPHEN 10 MG/ML IV SOLN
INTRAVENOUS | Status: AC
  Filled 2020-04-19: qty 200

## 2020-04-19 MED ORDER — ONDANSETRON HCL 4 MG/2ML IJ SOLN
4.0000 mg | Freq: Four times a day (QID) | INTRAMUSCULAR | Status: DC | PRN
  Administered 2020-04-19: 4 mg via INTRAVENOUS
  Filled 2020-04-19: qty 2

## 2020-04-19 MED ORDER — MIDAZOLAM HCL 2 MG/2ML IJ SOLN
INTRAMUSCULAR | Status: DC | PRN
  Administered 2020-04-19: 2 mg via INTRAVENOUS

## 2020-04-19 MED ORDER — ONDANSETRON HCL 4 MG/2ML IJ SOLN
INTRAMUSCULAR | Status: DC | PRN
  Administered 2020-04-19: 4 mg via INTRAVENOUS

## 2020-04-19 MED ORDER — ONDANSETRON HCL 4 MG/2ML IJ SOLN
INTRAMUSCULAR | Status: AC
  Filled 2020-04-19: qty 2

## 2020-04-19 MED ORDER — LACTATED RINGERS IV SOLN: INTRAVENOUS | Status: DC | PRN

## 2020-04-19 MED ORDER — DEXAMETHASONE SODIUM PHOSPHATE 10 MG/ML IJ SOLN
INTRAMUSCULAR | Status: DC | PRN
  Administered 2020-04-19: 10 mg via INTRAVENOUS

## 2020-04-19 MED ORDER — INDOCYANINE GREEN 25 MG IV SOLR
7.5000 mg | Freq: Once | INTRAVENOUS | Status: AC
  Administered 2020-04-19: 7.5 mg via INTRAVENOUS
  Filled 2020-04-19: qty 3

## 2020-04-19 MED ORDER — FENTANYL CITRATE (PF) 100 MCG/2ML IJ SOLN
25.0000 ug | INTRAMUSCULAR | Status: DC | PRN
  Administered 2020-04-19 (×3): 25 ug via INTRAVENOUS

## 2020-04-19 MED ORDER — EPHEDRINE 5 MG/ML INJ
INTRAVENOUS | Status: AC
  Filled 2020-04-19: qty 10

## 2020-04-19 MED ORDER — OXYCODONE HCL 5 MG PO TABS
5.0000 mg | ORAL_TABLET | ORAL | Status: DC | PRN
Start: 2020-04-19 — End: 2020-04-21
  Administered 2020-04-20: 5 mg via ORAL
  Filled 2020-04-19: qty 1

## 2020-04-19 MED ORDER — FENTANYL CITRATE (PF) 100 MCG/2ML IJ SOLN
INTRAMUSCULAR | Status: DC | PRN
  Administered 2020-04-19 (×2): 50 ug via INTRAVENOUS

## 2020-04-19 MED ORDER — PROPOFOL 10 MG/ML IV BOLUS
INTRAVENOUS | Status: DC | PRN
  Administered 2020-04-19: 160 mg via INTRAVENOUS
  Administered 2020-04-19: 40 mg via INTRAVENOUS

## 2020-04-19 MED ORDER — KETOROLAC TROMETHAMINE 30 MG/ML IJ SOLN
30.0000 mg | Freq: Four times a day (QID) | INTRAMUSCULAR | Status: DC | PRN
  Administered 2020-04-19: 30 mg via INTRAVENOUS
  Filled 2020-04-19: qty 1

## 2020-04-19 MED ORDER — DEXTROSE IN LACTATED RINGERS 5 % IV SOLN: INTRAVENOUS | Status: DC

## 2020-04-19 MED ORDER — ONDANSETRON 4 MG PO TBDP
4.0000 mg | ORAL_TABLET | Freq: Four times a day (QID) | ORAL | Status: DC | PRN
Start: 2020-04-19 — End: 2020-04-21

## 2020-04-19 MED ORDER — FENTANYL CITRATE (PF) 100 MCG/2ML IJ SOLN
INTRAMUSCULAR | Status: AC
  Filled 2020-04-19: qty 2

## 2020-04-19 MED ORDER — METOPROLOL TARTRATE 25 MG PO TABS
25.0000 mg | ORAL_TABLET | Freq: Two times a day (BID) | ORAL | Status: DC
  Administered 2020-04-19 – 2020-04-20 (×4): 25 mg via ORAL
  Filled 2020-04-19 (×4): qty 1

## 2020-04-19 MED ORDER — PROPOFOL 10 MG/ML IV BOLUS
INTRAVENOUS | Status: AC
  Filled 2020-04-19: qty 20

## 2020-04-19 MED ORDER — ONDANSETRON HCL 4 MG/2ML IJ SOLN
4.0000 mg | Freq: Once | INTRAMUSCULAR | Status: AC | PRN
  Administered 2020-04-19: 4 mg via INTRAVENOUS

## 2020-04-19 MED ORDER — LIDOCAINE-EPINEPHRINE 1 %-1:100000 IJ SOLN
INTRAMUSCULAR | Status: DC | PRN
  Administered 2020-04-19: 10 mL

## 2020-04-19 MED ORDER — ACETAMINOPHEN 10 MG/ML IV SOLN
INTRAVENOUS | Status: DC | PRN
  Administered 2020-04-19: 1000 mg via INTRAVENOUS

## 2020-04-19 MED ORDER — FENTANYL CITRATE (PF) 100 MCG/2ML IJ SOLN
INTRAMUSCULAR | Status: AC
  Administered 2020-04-19: 25 ug via INTRAVENOUS
  Filled 2020-04-19: qty 2

## 2020-04-19 MED ORDER — LIDOCAINE HCL (CARDIAC) PF 100 MG/5ML IV SOSY
PREFILLED_SYRINGE | INTRAVENOUS | Status: DC | PRN
  Administered 2020-04-19: 50 mg via INTRAVENOUS

## 2020-04-19 MED ORDER — EPHEDRINE SULFATE 50 MG/ML IJ SOLN
INTRAMUSCULAR | Status: DC | PRN
  Administered 2020-04-19: 10 mg via INTRAVENOUS

## 2020-04-19 MED ORDER — ROCURONIUM BROMIDE 10 MG/ML (PF) SYRINGE
PREFILLED_SYRINGE | INTRAVENOUS | Status: AC
  Filled 2020-04-19: qty 10

## 2020-04-19 MED ORDER — BUPIVACAINE HCL (PF) 0.25 % IJ SOLN
INTRAMUSCULAR | Status: DC | PRN
  Administered 2020-04-19: 10 mL

## 2020-04-19 MED ORDER — HYDROMORPHONE HCL 1 MG/ML IJ SOLN
0.5000 mg | INTRAMUSCULAR | Status: DC | PRN
Start: 2020-04-19 — End: 2020-04-21
  Administered 2020-04-19: 0.5 mg via INTRAVENOUS
  Filled 2020-04-19: qty 0.5

## 2020-04-19 MED ORDER — ACETAMINOPHEN 500 MG PO TABS
1000.0000 mg | ORAL_TABLET | Freq: Four times a day (QID) | ORAL | Status: DC
  Filled 2020-04-19 (×2): qty 2

## 2020-04-19 MED ORDER — DEXAMETHASONE SODIUM PHOSPHATE 10 MG/ML IJ SOLN
INTRAMUSCULAR | Status: AC
  Filled 2020-04-19: qty 1

## 2020-04-19 MED ORDER — SIMETHICONE 80 MG PO CHEW
40.0000 mg | CHEWABLE_TABLET | Freq: Four times a day (QID) | ORAL | Status: DC | PRN
  Administered 2020-04-20: 40 mg via ORAL
  Filled 2020-04-19 (×2): qty 1

## 2020-04-19 MED ORDER — MIDAZOLAM HCL 2 MG/2ML IJ SOLN
INTRAMUSCULAR | Status: AC
  Filled 2020-04-19: qty 2

## 2020-04-19 MED ORDER — CYCLOBENZAPRINE HCL 10 MG PO TABS: 10.0000 mg | ORAL_TABLET | Freq: Three times a day (TID) | ORAL | Status: DC | PRN

## 2020-04-19 MED ORDER — ROCURONIUM BROMIDE 100 MG/10ML IV SOLN
INTRAVENOUS | Status: DC | PRN
  Administered 2020-04-19: 10 mg via INTRAVENOUS
  Administered 2020-04-19: 50 mg via INTRAVENOUS

## 2020-04-19 MED ORDER — SUGAMMADEX SODIUM 200 MG/2ML IV SOLN
INTRAVENOUS | Status: DC | PRN
  Administered 2020-04-19: 200 mg via INTRAVENOUS

## 2020-04-19 MED ORDER — LIDOCAINE HCL (PF) 2 % IJ SOLN
INTRAMUSCULAR | Status: AC
  Filled 2020-04-19: qty 5

## 2020-04-19 MED ORDER — IBUPROFEN 400 MG PO TABS: 600.0000 mg | ORAL_TABLET | Freq: Four times a day (QID) | ORAL | Status: DC | PRN

## 2020-04-19 SURGICAL SUPPLY — 57 items
BAG PRESSURE INFUS OVAL 1000CC (MISCELLANEOUS) IMPLANT
BLADE SURG SZ11 CARB STEEL (BLADE) ×2 IMPLANT
CANNULA REDUC XI 12-8 STAPL (CANNULA) ×1
CANNULA REDUCER 12-8 DVNC XI (CANNULA) ×1 IMPLANT
CHLORAPREP W/TINT 26 (MISCELLANEOUS) ×2 IMPLANT
CLIP VESOLOCK MED LG 6/CT (CLIP) ×2 IMPLANT
COVER TIP SHEARS 8 DVNC (MISCELLANEOUS) ×1 IMPLANT
COVER TIP SHEARS 8MM DA VINCI (MISCELLANEOUS) ×1
COVER WAND RF STERILE (DRAPES) ×2 IMPLANT
DECANTER SPIKE VIAL GLASS SM (MISCELLANEOUS) ×2 IMPLANT
DEFOGGER SCOPE WARMER CLEARIFY (MISCELLANEOUS) ×2 IMPLANT
DERMABOND ADVANCED (GAUZE/BANDAGES/DRESSINGS) ×1
DERMABOND ADVANCED .7 DNX12 (GAUZE/BANDAGES/DRESSINGS) ×1 IMPLANT
DRAPE ARM DVNC X/XI (DISPOSABLE) ×4 IMPLANT
DRAPE COLUMN DVNC XI (DISPOSABLE) ×1 IMPLANT
DRAPE DA VINCI XI ARM (DISPOSABLE) ×4
DRAPE DA VINCI XI COLUMN (DISPOSABLE) ×1
ELECT CAUTERY BLADE TIP 2.5 (TIP) ×2
ELECT REM PT RETURN 9FT ADLT (ELECTROSURGICAL) ×2
ELECTRODE CAUTERY BLDE TIP 2.5 (TIP) ×1 IMPLANT
ELECTRODE REM PT RTRN 9FT ADLT (ELECTROSURGICAL) ×1 IMPLANT
GLOVE SURG ENC MOIS LTX SZ6.5 (GLOVE) ×4 IMPLANT
GLOVE SURG UNDER LTX SZ7 (GLOVE) ×4 IMPLANT
GOWN STRL REUS W/ TWL LRG LVL3 (GOWN DISPOSABLE) ×3 IMPLANT
GOWN STRL REUS W/TWL LRG LVL3 (GOWN DISPOSABLE) ×3
GRASPER SUT TROCAR 14GX15 (MISCELLANEOUS) ×2 IMPLANT
IRRIGATOR SUCT 8 DISP DVNC XI (IRRIGATION / IRRIGATOR) IMPLANT
IRRIGATOR SUCTION 8MM XI DISP (IRRIGATION / IRRIGATOR)
IV NS 1000ML (IV SOLUTION)
IV NS 1000ML BAXH (IV SOLUTION) IMPLANT
KIT PINK PAD W/HEAD ARE REST (MISCELLANEOUS) ×2
KIT PINK PAD W/HEAD ARM REST (MISCELLANEOUS) ×1 IMPLANT
LABEL OR SOLS (LABEL) ×2 IMPLANT
MANIFOLD NEPTUNE II (INSTRUMENTS) IMPLANT
NEEDLE HYPO 22GX1.5 SAFETY (NEEDLE) ×2 IMPLANT
NEEDLE INSUFFLATION 14GA 120MM (NEEDLE) IMPLANT
NS IRRIG 500ML POUR BTL (IV SOLUTION) ×2 IMPLANT
OBTURATOR OPTICAL STANDARD 8MM (TROCAR) ×1
OBTURATOR OPTICAL STND 8 DVNC (TROCAR) ×1
OBTURATOR OPTICALSTD 8 DVNC (TROCAR) ×1 IMPLANT
PACK LAP CHOLECYSTECTOMY (MISCELLANEOUS) ×2 IMPLANT
PENCIL ELECTRO HAND CTR (MISCELLANEOUS) ×2 IMPLANT
POUCH SPECIMEN RETRIEVAL 10MM (ENDOMECHANICALS) ×2 IMPLANT
SEAL CANN UNIV 5-8 DVNC XI (MISCELLANEOUS) ×3 IMPLANT
SEAL XI 5MM-8MM UNIVERSAL (MISCELLANEOUS) ×3
SET TUBE SMOKE EVAC HIGH FLOW (TUBING) ×2 IMPLANT
SOLUTION ELECTROLUBE (MISCELLANEOUS) ×2 IMPLANT
STAPLER CANNULA SEAL DVNC XI (STAPLE) ×1 IMPLANT
STAPLER CANNULA SEAL XI (STAPLE) ×1
STRIP CLOSURE SKIN 1/2X4 (GAUZE/BANDAGES/DRESSINGS) ×2 IMPLANT
SUT MNCRL 4-0 (SUTURE) ×1
SUT MNCRL 4-0 27XMFL (SUTURE) ×1
SUT VIC AB 3-0 SH 27 (SUTURE)
SUT VIC AB 3-0 SH 27X BRD (SUTURE) IMPLANT
SUT VICRYL 0 AB UR-6 (SUTURE) IMPLANT
SUTURE MNCRL 4-0 27XMF (SUTURE) ×1 IMPLANT
TROCAR XCEL NON-BLD 5MMX100MML (ENDOMECHANICALS) IMPLANT

## 2020-04-19 NOTE — Anesthesia Postprocedure Evaluation (Signed)
Anesthesia Post Note  Patient: Linda Giles  Procedure(s) Performed: XI ROBOTIC ASSISTED LAPAROSCOPIC CHOLECYSTECTOMY (N/A )  Patient location during evaluation: PACU Anesthesia Type: General Level of consciousness: awake and alert and oriented Pain management: pain level controlled Vital Signs Assessment: post-procedure vital signs reviewed and stable Respiratory status: spontaneous breathing Cardiovascular status: blood pressure returned to baseline Anesthetic complications: no   No complications documented.   Last Vitals:  Vitals:   04/19/20 1544 04/19/20 1545  BP:    Pulse: (!) 58 (!) 58  Resp: 16 14  Temp:  (!) 36.3 C  SpO2: 98% 100%    Last Pain:  Vitals:   04/19/20 1545  TempSrc:   PainSc: Asleep                 Arnez Stoneking

## 2020-04-19 NOTE — Anesthesia Procedure Notes (Signed)
Procedure Name: Intubation Date/Time: 04/19/2020 1:25 PM Performed by: Jaye Beagle, CRNA Pre-anesthesia Checklist: Patient identified, Emergency Drugs available, Suction available and Patient being monitored Patient Re-evaluated:Patient Re-evaluated prior to induction Oxygen Delivery Method: Circle system utilized Preoxygenation: Pre-oxygenation with 100% oxygen Induction Type: IV induction Ventilation: Mask ventilation without difficulty Laryngoscope Size: McGraph and 3 Grade View: Grade I Tube type: Oral Tube size: 7.0 mm Number of attempts: 1 Airway Equipment and Method: Stylet and Oral airway Placement Confirmation: ETT inserted through vocal cords under direct vision,  positive ETCO2 and breath sounds checked- equal and bilateral Secured at: 23 cm Tube secured with: Tape Dental Injury: Teeth and Oropharynx as per pre-operative assessment

## 2020-04-19 NOTE — Transfer of Care (Signed)
Immediate Anesthesia Transfer of Care Note  Patient: Linda Giles  Procedure(s) Performed: XI ROBOTIC ASSISTED LAPAROSCOPIC CHOLECYSTECTOMY (N/A )  Patient Location: PACU  Anesthesia Type:General  Level of Consciousness: awake  Airway & Oxygen Therapy: Patient Spontanous Breathing and Patient connected to face mask oxygen  Post-op Assessment: Report given to RN  Post vital signs: stable  Last Vitals:  Vitals Value Taken Time  BP 153/66 04/19/20 1507  Temp    Pulse 60 04/19/20 1512  Resp 13 04/19/20 1512  SpO2 100 % 04/19/20 1512  Vitals shown include unvalidated device data.  Last Pain:  Vitals:   04/19/20 1507  TempSrc:   PainSc: Asleep         Complications: No complications documented.

## 2020-04-19 NOTE — Progress Notes (Signed)
Pt admitted to [facility] via wheelchair accompanied by two transporters. Pt on RA.

## 2020-04-19 NOTE — Plan of Care (Signed)
Pt Linda Giles. Calm and cooperative and able to voice her needs. Pt is independent. Pt c/o of 4/10 of abdominal pain but refuses to take Tylenol. Scheduled dose of Metoprolol adm for increased BP, effective. Pt on IVF and IV Abx. Pt placed on contact isolation due to possible bed bug presence communicated from ER Personnel who stated "we found a bedbug in the patient's personal pillow". When asked pt if aware of the bed bugs she stated "there are no bed bugs at her place". The reason for isolation was thoroughly explained to pt with opportunity to call if she has any questions. Safety measures in place. Will continue to monitor.  Problem: Education: Goal: Knowledge of General Education information will improve Description: Including pain rating scale, medication(s)/side effects and non-pharmacologic comfort measures Outcome: Progressing   Problem: Health Behavior/Discharge Planning: Goal: Ability to manage health-related needs will improve Outcome: Progressing   Problem: Clinical Measurements: Goal: Ability to maintain clinical measurements within normal limits will improve Outcome: Progressing Goal: Will remain free from infection Outcome: Progressing Goal: Diagnostic test results will improve Outcome: Progressing Goal: Respiratory complications will improve Outcome: Progressing Goal: Cardiovascular complication will be avoided Outcome: Progressing   Problem: Activity: Goal: Risk for activity intolerance will decrease Outcome: Progressing   Problem: Nutrition: Goal: Adequate nutrition will be maintained Outcome: Progressing   Problem: Coping: Goal: Level of anxiety will decrease Outcome: Progressing   Problem: Elimination: Goal: Will not experience complications related to bowel motility Outcome: Progressing Goal: Will not experience complications related to urinary retention Outcome: Progressing   Problem: Pain Managment: Goal: General experience of comfort will  improve Outcome: Progressing   Problem: Safety: Goal: Ability to remain free from injury will improve Outcome: Progressing   Problem: Skin Integrity: Goal: Risk for impaired skin integrity will decrease Outcome: Progressing

## 2020-04-19 NOTE — Op Note (Signed)
Operative note  Pre-operative Diagnosis: Acute calculus cholecystitis  Post-operative Diagnosis: Same  Procedure: Robot assisted laparoscopic cholecystectomy  Surgeon: Duanne Guess, MD  Anesthesia: GETA  Findings: Significant edema surrounding the gallbladder consistent with acute cholecystitis.  Normal biliary anatomy.  Estimated Blood Loss: Less than 1 cc       Specimens: Gallbladder           Complications: none immediately apparent  Procedure In Detail: The patient was seen again in the holding room. The benefits, complications, treatment options, and expected outcomes were discussed with the patient. The risks of bleeding, infection, recurrence of symptoms, failure to resolve symptoms, bile duct damage, bile duct leak, retained common bile duct stone, bowel injury, any of which could require further surgery and/or ERCP, stent, or papillotomy were reviewed with the patient. The likelihood of improving the patient's symptoms with return to their baseline status is good.  The patient and/or family concurred with the proposed plan, giving informed consent.  The patient was taken to operating room, identified and the procedure verified as laparoscopic cholecystectomy.  A time out was held and the above information confirmed.  Prior to the induction of general anesthesia, antibiotic prophylaxis was administered. VTE prophylaxis was in place. General endotracheal anesthesia was then administered and tolerated well. After the induction, the abdomen was prepped with Chloraprep and draped in the sterile fashion. The patient was positioned in the supine position.  Optiview technique was used to enter the abdomen in the right upper quadrant via a standard 5 mm laparoscopic trocar.  Pneumoperitoneum was then created with CO2 and tolerated well without any adverse changes in the patient's vital signs.  A 12 mm robotic trocar along with three 8-mm robotic trochars were placed under direct vision.   All skin incisions were infiltrated with a local anesthetic agent before making the incision and placing the trocars.   The patient was positioned in 15 degrees of reverse Trendelenburg and tilted 10 degrees to the left.  The robot was brought to the surgical field and docked in the standard fashion.  We made sure all the instrumentation was kept in direct view at all times and that there were no collision between the arms.  I scrubbed out and went to the console.  The gallbladder was identified, the fundus grasped and retracted cephalad. Adhesions were lysed bluntly. The infundibulum was grasped and retracted laterally, exposing the peritoneum overlying the triangle of Calot. This was then divided and exposed in a blunt fashion. An extended critical view of the cystic duct and cystic artery was obtained.  The cystic duct was clearly identified and bluntly dissected away from the surrounding tissues, as was the cystic artery.  Using ICG cholangiography we visualized the cystic duct and confirmed that there was no aberrant biliary ductal anatomy nor any evidence of bile duct injury.  Both cystic duct and cystic artery were clipped and divided. The gallbladder was taken from the gallbladder fossa in a retrograde fashion with the electrocautery. Hemostasis was achieved with the electrocautery. Inspection of the right upper quadrant was performed. No bleeding, bile duct injury or leak, or bowel injury was noted.  The gallbladder was then placed in an Endopouch bag. The robotic instruments were removed and robotic arms were undocked in the standard fashion.    I scrubbed back in.  The gallbladder was removed via the 12 mm trocar site.  Using the PMI and a Carter-Thomason cone, the 12 mm port site was then closed with interrupted 0 Vicryl  sutures.  The remaining 8 mm ports were removed and pneumoperitoneum was released.  Each port site was closed with deep dermal 3-0 Vicryl.  4-0 subcuticular Monocryl was used  to close the skin. Dermabond was applied, followed by Steri-Strips.  The patient was then awakened, extubated, and taken to the postanesthesia recovery unit in stable condition.   Sponge, lap, and needle counts were reported to be correct number at closure and at the conclusion of the case.          Duanne Guess, MD FACS

## 2020-04-19 NOTE — H&P (Signed)
  ERROR--duplicate    Review of Systems  Physical Exam

## 2020-04-19 NOTE — H&P (Addendum)
[facility] SURGICAL HISTORY & PHYSICAL (cpt [phone])  HISTORY OF PRESENT ILLNESS (HPI):  55 y.o. female presented to [facility] ED yesterday for abdominal pain. Patient reports the acute onset of centralized abdominal pain radiating to her RUQ on Friday night. She described the pain as an intense throbbing pain. She has had no relief from this since the onset. Nothing specific resulted in the onset of pain. She endorses subjective chills, nausea, and emesis with the pain. No fever, cough, CP, SOB, or urinary changes. She does believe she has had similar pain in the past but not as severe and typically resolved spontaneously. Only previous intra-abdominal surgery is hysterectomy. Laboratory work up in the ED was reassuring however, CT Abdomen/Pelvis did show cholelithiasis with some peri-cholecystic fluid. She underwent RUQ [place] which was concerning for gallstone at the neck of the gallbladder and likely cholecystitis.   General surgery is consulted by emergency medicine physician Dr Loleta Rose, MD for evaluation and management of acute cholecystitis.   PAST MEDICAL HISTORY (PMH):  Past Medical History:  Diagnosis Date   Chronic back pain    Hypertension     Reviewed. Otherwise negative.   PAST SURGICAL HISTORY (PSH):  Past Surgical History:  Procedure Laterality Date   ABDOMINAL HYSTERECTOMY      Reviewed. Otherwise negative.   MEDICATIONS:  Prior to Admission medications   Medication Sig Start Date End Date Taking? Authorizing Provider  aspirin EC 81 MG EC tablet Take 1 tablet (81 mg total) by mouth daily. 02/12/16   Auburn Bilberry, MD  cyclobenzaprine (FLEXERIL) 10 MG tablet Take 1 tablet (10 mg total) by mouth 3 (three) times daily as needed. 01/25/19   Fisher, Roselyn Bering, PA-C  metoprolol tartrate (LOPRESSOR) 25 MG tablet Take 1 tablet (25 mg total) by mouth 2 (two) times daily. 02/12/16   Auburn Bilberry, MD  ondansetron (ZOFRAN) 4 MG tablet Take 1 tablet (4 mg total) by mouth  daily as needed for nausea or vomiting. 05/19/19   Minna Antis, MD  vitamin B-12 (CYANOCOBALAMIN) 1000 MCG tablet Take 1,000 mcg by mouth daily.    [provider]     ALLERGIES:  Allergies  Allergen Reactions   Compazine [Prochlorperazine Edisylate] Swelling     SOCIAL HISTORY:  Social History   Socioeconomic History   Marital status: Single    Spouse name: Not on file   Number of children: Not on file   Years of education: Not on file   Highest education level: Not on file  Occupational History   Not on file  Tobacco Use   Smoking status: Never Smoker   Smokeless tobacco: Never Used  Substance and Sexual Activity   Alcohol use: No   Drug use: No   Sexual activity: Not on file  Other Topics Concern   Not on file  Social History Narrative   Not on file   Social Determinants of Health   Financial Resource Strain: Not on file  Food Insecurity: Not on file  Transportation Needs: Not on file  Physical Activity: Not on file  Stress: Not on file  Social Connections: Not on file  Intimate Partner Violence: Not on file     FAMILY HISTORY:  Family History  Problem Relation Age of Onset   Heart disease Mother    Hypertension Mother    CVA Mother    Colon cancer Mother    Cervical cancer Mother     Otherwise negative.   REVIEW OF SYSTEMS:  Review of  Systems  Constitutional: Positive for chills. Negative for fever.  HENT: Negative for congestion and sore throat.   Respiratory: Negative for cough and shortness of breath.   Cardiovascular: Negative for chest pain and palpitations.  Gastrointestinal: Positive for abdominal pain, nausea and vomiting. Negative for constipation and diarrhea.  Genitourinary: Negative for dysuria and urgency.  All other systems reviewed and are negative.   VITAL SIGNS:  Temp:  [98.1 F (36.7 C)-98.7 F (37.1 C)] 98.1 F (36.7 C) (04/05 0432) Pulse Rate:  [53-64] 53 (04/05 0432) Resp:  [12-20] 18 (04/05 0432) BP:  (145-168)/(73-91) 145/73 (04/05 0432) SpO2:  [97 %-100 %] 100 % (04/05 0432) Weight:  [102.1 kg] 102.1 kg (04/04 1911)     Height: 5\' 8"  (172.7 cm) Weight: 102.1 kg BMI (Calculated): 34.22   PHYSICAL EXAM:  Physical Exam Vitals and nursing note reviewed. Exam conducted with a chaperone present.  Constitutional:      General: She is not in acute distress.    Appearance: Normal appearance. She is obese. She is not ill-appearing.  HENT:     Head: Normocephalic and atraumatic.  Eyes:     General: No scleral icterus.    Conjunctiva/sclera: Conjunctivae normal.  Cardiovascular:     Rate and Rhythm: Normal rate and regular rhythm.     Pulses: Normal pulses.     Heart sounds: No murmur heard.   Pulmonary:     Effort: Pulmonary effort is normal. No respiratory distress.  Abdominal:     General: A surgical scar is present. There is no distension.     Palpations: Abdomen is soft.     Tenderness: There is abdominal tenderness in the right upper quadrant and epigastric area. There is no guarding or rebound. Positive signs include Murphy's sign.     Comments: Abdomen is obese, soft, tenderness in the RUQ with positive Murphy's Sign, no rebound/guarding  Genitourinary:    Comments: Deferred Skin:    General: Skin is warm and dry.     Coloration: Skin is not jaundiced.     Findings: No erythema.  Neurological:     General: No focal deficit present.     Mental Status: She is alert and oriented to person, place, and time.  Psychiatric:        Mood and Affect: Mood normal.        Behavior: Behavior normal.     INTAKE/OUTPUT:  This shift: No intake/output data recorded.  Last 2 shifts: @IOLAST2SHIFTS @  Labs:  CBC Latest Ref Rng & Units 04/19/2020 04/18/2020 01/28/2020  WBC 4.0 - 10.5 K/uL 9.8 10.5 8.8  Hemoglobin 12.0 - 15.0 g/dL 06/18/2020 11.8(L) 11.6(L)  Hematocrit 36.0 - 46.0 % 39.0 38.5 37.7  Platelets 150 - 400 K/uL 396 415(H) 405(H)   CMP Latest Ref Rng & Units 04/19/2020 04/18/2020  01/28/2020  Glucose 70 - 99 mg/dL 06/18/2020) 01/30/2020) 981(X)  BUN 6 - 20 mg/dL 9 13 14   Creatinine 0.44 - 1.00 mg/dL 914(N 829(F  Sodium 135 - 145 mmol/L 137 138 142  Potassium 3.5 - 5.1 mmol/L 3.6 3.7 3.6  Chloride 98 - 111 mmol/L 103 102 103  CO2 22 - 32 mmol/L 26 28 28   Calcium 8.9 - 10.3 mg/dL 9.2 9.1 9.0  Total Protein 6.5 - 8.1 g/dL 8.1 8.0 -  Total Bilirubin 0.3 - 1.2 mg/dL 0.5 0.5 -  Alkaline Phos 38 - 126 U/L 57 59 -  AST 15 - 41 U/L 15 14(L) -  ALT 0 - 44  U/L 13 14 -     Imaging studies:   CT Abdomen/Pelvis (04/18/2020) personally reviewed with cholelithiasis and peri-cholecystic fluid concerning for acute cholecystitis, and radiologist report reviewed below:  IMPRESSION: 1. Cholelithiasis with findings concerning for acute cholecystitis. Further evaluation with ultrasound is recommended. 2. Moderate size hiatal hernia. No bowel obstruction. Normal appendix. 3. Aortic Atherosclerosis (ICD10-I70.0).   RUQ [place] (04/18/2020) personally reviewed showing gallstone at the neck of the gallbladder, and radiologist report reviewed below:  IMPRESSION: Cholelithiasis. 1.3 cm gallstone lodged in the neck of the gallbladder. Mild gallbladder wall thickening without sonographic Murphy sign. Recommend clinical correlation to exclude acute cholecystitis.   Assessment/Plan: (ICD-10's:, K81.0) 55 y.o. female with RUQ pain and likely acute cholecystitis.    - Admit to general surgery  - Plan for robotic assisted laparoscopic cholecystectomy this afternoon with Dr Lady Gary, pending OR/Anesthesia availability  - All risks, benefits, and alternatives to above procedure(s) were discussed with the patient, all of her questions were answered to her expressed satisfaction, patient expresses she wishes to proceed, and informed consent was obtained.  - NPO + IVF Resuscitation  - IV ABx (Rocephin)   - Monitor abdominal examination  - Pain control prn; antiemetics prn   - DVT prophylaxis; hold for  OR  All of the above findings and recommendations were discussed with the patient, and all of her questions were answered to her expressed satisfaction.  -- Lynden Oxford, PA-C [facility] 04/19/2020, 8:22 AM [phone] M-F: 7am - 4pm  I saw and evaluated the patient.  I agree with the above documentation, exam, and plan, which I have edited where appropriate. Aspin Palomarez  1:10 PM

## 2020-04-19 NOTE — Anesthesia Preprocedure Evaluation (Signed)
Anesthesia Evaluation  Patient identified by MRN, date of birth, ID band Patient awake    Reviewed: Allergy & Precautions, NPO status , Patient's Chart, lab work & pertinent test results  Airway Mallampati: II  TM Distance: >3 FB     Dental   Pulmonary neg pulmonary ROS,    Pulmonary exam normal        Cardiovascular hypertension, Normal cardiovascular exam     Neuro/Psych negative neurological ROS  negative psych ROS   GI/Hepatic negative GI ROS, Neg liver ROS,   Endo/Other  negative endocrine ROS  Renal/GU negative Renal ROS  negative genitourinary   Musculoskeletal  (+) Arthritis , Osteoarthritis,    Abdominal Normal abdominal exam  (+)   Peds negative pediatric ROS (+)  Hematology negative hematology ROS (+)   Anesthesia Other Findings Past Medical History: No date: Chronic back pain No date: Hypertension  Reproductive/Obstetrics                             Anesthesia Physical Anesthesia Plan  ASA: II  Anesthesia Plan: General   Post-op Pain Management:    Induction: Intravenous  PONV Risk Score and Plan:   Airway Management Planned: Oral ETT  Additional Equipment:   Intra-op Plan:   Post-operative Plan: Extubation in OR  Informed Consent: I have reviewed the patients History and Physical, chart, labs and discussed the procedure including the risks, benefits and alternatives for the proposed anesthesia with the patient or authorized representative who has indicated his/her understanding and acceptance.     Dental advisory given  Plan Discussed with: CRNA and Surgeon  Anesthesia Plan Comments:         Anesthesia Quick Evaluation

## 2020-04-20 LAB — COMPREHENSIVE METABOLIC PANEL
ALT: 32 U/L (ref 0–44)
AST: 33 U/L (ref 15–41)
Albumin: 3.7 g/dL (ref 3.5–5.0)
Alkaline Phosphatase: 63 U/L (ref 38–126)
Anion gap: 10 (ref 5–15)
BUN: 10 mg/dL (ref 6–20)
CO2: 26 mmol/L (ref 22–32)
Calcium: 9.3 mg/dL (ref 8.9–10.3)
Chloride: 102 mmol/L (ref 98–111)
Creatinine, Ser: 0.73 mg/dL (ref 0.44–1.00)
GFR, Estimated: >60 mL/min (ref >60)
Glucose, Bld: 127 mg/dL — ABNORMAL HIGH (ref 70–99)
Potassium: 3.9 mmol/L (ref 3.5–5.1)
Sodium: 138 mmol/L (ref 135–145)
Total Bilirubin: 0.5 mg/dL (ref 0.3–1.2)
Total Protein: 7.5 g/dL (ref 6.5–8.1)

## 2020-04-20 LAB — CBC
HCT: 38.2 % (ref 36.0–46.0)
Hemoglobin: 12 g/dL (ref 12.0–15.0)
MCH: 25 pg — ABNORMAL LOW (ref 26.0–34.0)
MCHC: 31.4 g/dL (ref 30.0–36.0)
MCV: 79.6 fL — ABNORMAL LOW (ref 80.0–100.0)
Platelets: 430 10*3/uL — ABNORMAL HIGH (ref 150–400)
RBC: 4.8 MIL/uL (ref 3.87–5.11)
RDW: 13.9 % (ref 11.5–15.5)
WBC: 14.4 10*3/uL — ABNORMAL HIGH (ref 4.0–10.5)
nRBC: 0 % (ref 0.0–0.2)

## 2020-04-20 LAB — MAGNESIUM: Magnesium: 1.9 mg/dL (ref 1.7–2.4)

## 2020-04-20 LAB — PHOSPHORUS: Phosphorus: 3.7 mg/dL (ref 2.5–4.6)

## 2020-04-20 MED ORDER — ONDANSETRON 4 MG PO TBDP: 4.0000 mg | ORAL_TABLET | Freq: Four times a day (QID) | ORAL | 0 refills | Status: DC | PRN

## 2020-04-20 MED ORDER — OXYCODONE HCL 5 MG PO TABS: 5.0000 mg | ORAL_TABLET | ORAL | 0 refills | Status: DC | PRN

## 2020-04-20 MED ORDER — IBUPROFEN 600 MG PO TABS: 600.0000 mg | ORAL_TABLET | Freq: Four times a day (QID) | ORAL | 0 refills | Status: DC | PRN

## 2020-04-20 NOTE — Discharge Summary (Addendum)
[facility] SURGICAL DISCHARGE SUMMARY  Patient ID: Linda Giles MRN: [id number] DOB/AGE: 53-Jan-1967 53 y.o.  Admit date: 04/18/2020 Discharge date: 04/20/2020  Discharge Diagnoses Patient Active Problem List   Diagnosis Date Noted   Acute calculous cholecystitis 04/19/2020    Consultants None  Procedures 04/19/2020:  Robotic assisted laparoscopic cholecystectomy  HPI: 55 y.o. female presented to [facility] ED yesterday for abdominal pain. Patient reports the acute onset of centralized abdominal pain radiating to her RUQ on Friday night. She described the pain as an intense throbbing pain. She has had no relief from this since the onset. Nothing specific resulted in the onset of pain. She endorses subjective chills, nausea, and emesis with the pain. No fever, cough, CP, SOB, or urinary changes. She does believe she has had similar pain in the past but not as severe and typically resolved spontaneously. Only previous intra-abdominal surgery is hysterectomy. Laboratory work up in the ED was reassuring however, CT Abdomen/Pelvis did show cholelithiasis with some peri-cholecystic fluid. She underwent RUQ [place] which was concerning for gallstone at the neck of the gallbladder and likely cholecystitis.   Hospital Course: Informed consent was obtained and documented, and patient underwent uneventful robotic assisted laparoscopic cholecystectomy (Dr Lady Gary, 04/19/2020).  Post-operatively, patient's pain/symptoms improved/resolved and advancement of patient's diet and ambulation were well-tolerated. The remainder of patient's hospital course was essentially unremarkable, and discharge planning was initiated accordingly with patient safely able to be discharged home with appropriate discharge instructions, pain control, and outpatient follow-up after all of her questions were answered to her expressed satisfaction.   Discharge Condition: Good   Physical Examination:  Constitutional:  Well appearing female, NAD Pulmonary: Normal effort, no respiratory distress Gastrointestinal: Soft, incisional soreness, non-distended, no rebound/guarding Skin: Laparoscopic incisions are CDI with steri-strips, no erythema or drainage    Allergies as of 04/20/2020       Reactions   Compazine [prochlorperazine Edisylate] Swelling        Medication List     TAKE these medications    acetaminophen 500 MG tablet Commonly known as: TYLENOL Take 1,000 mg by mouth every 6 (six) hours as needed for moderate pain or headache.   cyclobenzaprine 10 MG tablet Commonly known as: FLEXERIL Take 1 tablet (10 mg total) by mouth 3 (three) times daily as needed. What changed: reasons to take this   ibuprofen 600 MG tablet Commonly known as: ADVIL Take 1 tablet (600 mg total) by mouth every 6 (six) hours as needed for fever, headache or moderate pain. What changed:  medication strength how much to take reasons to take this   meloxicam 15 MG tablet Commonly known as: MOBIC Take 15 mg by mouth daily as needed for pain.   multivitamin with minerals Tabs tablet Take 1 tablet by mouth daily.   omeprazole 20 MG tablet Commonly known as: PRILOSEC OTC Take 20 mg by mouth daily.   oxyCODONE 5 MG immediate release tablet Commonly known as: Oxy IR/ROXICODONE Take 1 tablet (5 mg total) by mouth every 4 (four) hours as needed for severe pain or breakthrough pain.          Follow-up Information     Donovan Kail, PA-C. Schedule an appointment as soon as possible for a visit in 2 week(s).   Specialty: Physician Assistant Why: s/p lap cholecystectomy  Contact information: [address] [place] [address] [phone]                  Time spent on  discharge management including discussion of hospital course, clinical condition, outpatient instructions, prescriptions, and follow up with the patient and members of the medical team: >30 minutes  -- Lynden Oxford , PA-C [facility]  04/20/2020, 8:51 AM [phone] M-F: 7am - 4pm  I saw and evaluated the patient.  I agree with the above documentation, exam, and plan, which I have edited where appropriate. Duanne Guess  9:43 AM

## 2020-04-20 NOTE — Plan of Care (Signed)
Pt Axox4. Calm and cooperative and able to voice her needs. Pt is independent. Pt c/o of abdominal pain but still refuses to take Tylenol. Prn Dilaudid adm and ice pack provided, effective. Scheduled dose of Metoprolol adm for increased BP, effective.  Safety measures in place. Will continue to monitor.  Problem: Education: Goal: Knowledge of General Education information will improve Description: Including pain rating scale, medication(s)/side effects and non-pharmacologic comfort measures Outcome: Progressing   Problem: Health Behavior/Discharge Planning: Goal: Ability to manage health-related needs will improve Outcome: Progressing   Problem: Clinical Measurements: Goal: Ability to maintain clinical measurements within normal limits will improve Outcome: Progressing Goal: Will remain free from infection Outcome: Progressing Goal: Diagnostic test results will improve Outcome: Progressing Goal: Respiratory complications will improve Outcome: Progressing Goal: Cardiovascular complication will be avoided Outcome: Progressing   Problem: Activity: Goal: Risk for activity intolerance will decrease Outcome: Progressing   Problem: Nutrition: Goal: Adequate nutrition will be maintained Outcome: Progressing   Problem: Coping: Goal: Level of anxiety will decrease Outcome: Progressing   Problem: Elimination: Goal: Will not experience complications related to bowel motility Outcome: Progressing Goal: Will not experience complications related to urinary retention Outcome: Progressing   Problem: Pain Managment: Goal: General experience of comfort will improve Outcome: Progressing   Problem: Safety: Goal: Ability to remain free from injury will improve Outcome: Progressing   Problem: Skin Integrity: Goal: Risk for impaired skin integrity will decrease Outcome: Progressing

## 2020-04-20 NOTE — Plan of Care (Signed)

## 2020-04-20 NOTE — Plan of Care (Signed)
  Problem: Education: Goal: Knowledge of General Education information will improve Description: Including pain rating scale, medication(s)/side effects and non-pharmacologic comfort measures 04/20/2020 1903 by Gerarda Gunther, RN Outcome: Adequate for Discharge 04/20/2020 1205 by Gerarda Gunther, RN Outcome: Progressing   Problem: Health Behavior/Discharge Planning: Goal: Ability to manage health-related needs will improve 04/20/2020 1903 by Gerarda Gunther, RN Outcome: Adequate for Discharge 04/20/2020 1205 by Gerarda Gunther, RN Outcome: Progressing   Problem: Clinical Measurements: Goal: Ability to maintain clinical measurements within normal limits will improve 04/20/2020 1903 by Gerarda Gunther, RN Outcome: Adequate for Discharge 04/20/2020 1205 by Gerarda Gunther, RN Outcome: Progressing Goal: Will remain free from infection 04/20/2020 1903 by Gerarda Gunther, RN Outcome: Adequate for Discharge 04/20/2020 1205 by Gerarda Gunther, RN Outcome: Progressing Goal: Diagnostic test results will improve 04/20/2020 1903 by Gerarda Gunther, RN Outcome: Adequate for Discharge 04/20/2020 1205 by Gerarda Gunther, RN Outcome: Progressing Goal: Respiratory complications will improve 04/20/2020 1903 by Gerarda Gunther, RN Outcome: Adequate for Discharge 04/20/2020 1205 by Gerarda Gunther, RN Outcome: Progressing Goal: Cardiovascular complication will be avoided 04/20/2020 1903 by Gerarda Gunther, RN Outcome: Adequate for Discharge 04/20/2020 1205 by Gerarda Gunther, RN Outcome: Progressing   Problem: Activity: Goal: Risk for activity intolerance will decrease 04/20/2020 1903 by Gerarda Gunther, RN Outcome: Adequate for Discharge 04/20/2020 1205 by Gerarda Gunther, RN Outcome: Progressing   Problem: Nutrition: Goal: Adequate nutrition will be maintained 04/20/2020 1903 by Gerarda Gunther, RN Outcome: Adequate for Discharge 04/20/2020 1205 by Gerarda Gunther, RN Outcome: Progressing   Problem: Coping: Goal: Level of anxiety  will decrease 04/20/2020 1903 by Gerarda Gunther, RN Outcome: Adequate for Discharge 04/20/2020 1205 by Gerarda Gunther, RN Outcome: Progressing   Problem: Elimination: Goal: Will not experience complications related to bowel motility 04/20/2020 1903 by Gerarda Gunther, RN Outcome: Adequate for Discharge 04/20/2020 1205 by Gerarda Gunther, RN Outcome: Progressing Goal: Will not experience complications related to urinary retention 04/20/2020 1903 by Gerarda Gunther, RN Outcome: Adequate for Discharge 04/20/2020 1205 by Gerarda Gunther, RN Outcome: Progressing   Problem: Pain Managment: Goal: General experience of comfort will improve 04/20/2020 1903 by Gerarda Gunther, RN Outcome: Adequate for Discharge 04/20/2020 1205 by Gerarda Gunther, RN Outcome: Progressing   Problem: Safety: Goal: Ability to remain free from injury will improve 04/20/2020 1903 by Gerarda Gunther, RN Outcome: Adequate for Discharge 04/20/2020 1205 by Gerarda Gunther, RN Outcome: Progressing   Problem: Skin Integrity: Goal: Risk for impaired skin integrity will decrease 04/20/2020 1903 by Gerarda Gunther, RN Outcome: Adequate for Discharge 04/20/2020 1205 by Gerarda Gunther, RN Outcome: Progressing

## 2020-04-20 NOTE — Discharge Instructions (Signed)
In addition to included general post-operative instructions,  Diet: Resume home diet. Recommend avoiding or limiting fatty/greasy foods over the next few days/week. If you do eat these, you may (or may not) notice diarrhea. This is expected while your body adjusts to not having a gallbladder, and it typically resolves with time.   Activity: No heavy lifting >20 pounds (children, pets, laundry, garbage) for 4 weeks, but light activity and walking are encouraged. Do not drive or drink alcohol if taking narcotic pain medications or having pain that might distract from driving.  Wound care: 2 days after surgery (04/07), you may shower/get incision wet with soapy water and pat dry (do not rub incisions), but no baths or submerging incision underwater until follow-up.   Medications: Resume all home medications. For mild to moderate pain: acetaminophen (Tylenol) or ibuprofen/naproxen (if no kidney disease). Combining Tylenol with alcohol can substantially increase your risk of causing liver disease. Narcotic pain medications, if prescribed, can be used for severe pain, though may cause nausea, constipation, and drowsiness. Do not combine Tylenol and Percocet (or similar) within a 6 hour period as Percocet (and similar) contain(s) Tylenol. If you do not need the narcotic pain medication, you do not need to fill the prescription.  Call office [phone] / [phone]) at any time if any questions, worsening pain, fevers/chills, bleeding, drainage from incision site, or other concerns.

## 2020-04-21 LAB — SURGICAL PATHOLOGY

## 2020-04-27 ENCOUNTER — Telehealth: Payer: Self-pay | Admitting: General Surgery

## 2020-04-27 NOTE — Telephone Encounter (Signed)
Patient had lap chole done with Dr. Lady Gary on 04/19/20.  She is having abdominal pain with some nausea.  No fever, chills or vomiting.  Has been using some Tylenol. There is no redness or oozing from the surgical site.  Concerned about the abdominal pain.  Please call.  Thank you.

## 2020-04-27 NOTE — Telephone Encounter (Signed)
Spoke with the patient and she reports some moderate mid abdominal pain that moves from left to right and comes and goes. She also states that she is still having some nausea even with taking the Zofran. She does report some loose stools.  I spoke with Dr Aleen Campi and he advised the patient just monitor her symptoms for now. She should keep to a low fat diet as her body is still adjusting. She may also take Benefiber or Metamucil once a day to help with the loose stools. She is amendable to this and is aware to call back if the pain does not get better or her nausea gets worse and she will need to be seen.

## 2020-04-27 NOTE — Telephone Encounter (Signed)
LVM for pt to call clinic

## 2020-05-04 ENCOUNTER — Ambulatory Visit (INDEPENDENT_AMBULATORY_CARE_PROVIDER_SITE_OTHER): Payer: BC Managed Care – PPO | Admitting: Physician Assistant

## 2020-05-04 ENCOUNTER — Other Ambulatory Visit: Payer: Self-pay

## 2020-05-04 ENCOUNTER — Encounter: Payer: Self-pay | Admitting: Physician Assistant

## 2020-05-04 VITALS — BP 151/88 | HR 73 | Temp 98.7°F | Ht 66.5 in | Wt 265.2 lb

## 2020-05-04 DIAGNOSIS — Z09 Encounter for follow-up examination after completed treatment for conditions other than malignant neoplasm: Secondary | ICD-10-CM

## 2020-05-04 DIAGNOSIS — K8000 Calculus of gallbladder with acute cholecystitis without obstruction: Secondary | ICD-10-CM

## 2020-05-04 NOTE — Progress Notes (Signed)
[facility] POST-OP OFFICE VISIT  05/04/2020  HPI: Linda Giles is a 55 y.o. female 15 days s/p robotic assisted laparoscopic cholecystectomy for acute cholecystitis with Dr Lady Gary.   She is doing reasonably well She reports that the first few days were "rough" but got better relatively quickly She continues to have occasional "discomfort" in her RUQ with certain movements but this is mild. She only needs pain medications infrequently She continues to struggle with nausea and decreased appetite, but it sounds like this was present prior to procedure for some time as well. Zofran is helping No fever, chills, emesis, diarrhea.   No issues with incisions No other complaints  Vital signs: BP (!) 151/88   Pulse 73   Temp 98.7 F (37.1 C) (Oral)   Ht 5' 6.5" (1.689 m)   Wt 265 lb 3.2 oz (120.3 kg)   SpO2 96%   BMI 42.16 kg/m    Physical Exam: Constitutional: Well appearing female, NAD Abdomen: Soft, non-tender, non-distended, no rebound/guarding Skin: Laparoscopic incisions are well healed, no erythema or drainage   Assessment/Plan: This is a 55 y.o. female 15 days s/p robotic assisted laparoscopic cholecystectomy for acute cholecystitis    - Continue prn pain medications  - Continue antiemetics prn; I will be happy to refill this if needed  - Encouraged supplementing diet with protein shakes given decreased appetite. Encouraged her that this typically improves with time.   - Reviewed wound care  - Reviewed lifting restrictions  - Reviewed surgical pathology; CCC, negative for malignancy  - She will follow up with [place] on an as needed basis   -- Lynden Oxford, PA-C [facility] 05/04/2020, 10:42 AM [phone] M-F: 7am - 4pm

## 2020-05-04 NOTE — Patient Instructions (Signed)
Encourage protein shakes during the day if you are not having any appetite throughout the day. If nausea consists please give [place] a call. Continue with lifting restrictions for another 2 more weeks. Please give [place] a call if you have any questions or concerns.

## 2020-09-13 ENCOUNTER — Emergency Department
Admission: EM | Admit: 2020-09-13 | Discharge: 2020-09-13 | Disposition: A | Discharge status: Home / Self Care | Payer: BC Managed Care – PPO | Attending: Emergency Medicine | Admitting: Emergency Medicine

## 2020-09-13 ENCOUNTER — Emergency Department: Payer: BC Managed Care – PPO

## 2020-09-13 ENCOUNTER — Other Ambulatory Visit: Payer: Self-pay

## 2020-09-13 DIAGNOSIS — I10 Essential (primary) hypertension: Secondary | ICD-10-CM | POA: Diagnosis not present

## 2020-09-13 DIAGNOSIS — R079 Chest pain, unspecified: Secondary | ICD-10-CM | POA: Insufficient documentation

## 2020-09-13 DIAGNOSIS — R42 Dizziness and giddiness: Secondary | ICD-10-CM | POA: Insufficient documentation

## 2020-09-13 LAB — BASIC METABOLIC PANEL
Anion gap: 7 (ref 5–15)
BUN: 10 mg/dL (ref 6–20)
CO2: 31 mmol/L (ref 22–32)
Calcium: 9.3 mg/dL (ref 8.9–10.3)
Chloride: 104 mmol/L (ref 98–111)
Creatinine, Ser: 0.85 mg/dL (ref 0.44–1.00)
GFR, Estimated: >60 mL/min (ref >60)
Glucose, Bld: 132 mg/dL — ABNORMAL HIGH (ref 70–99)
Potassium: 3.6 mmol/L (ref 3.5–5.1)
Sodium: 142 mmol/L (ref 135–145)

## 2020-09-13 LAB — CBC
HCT: 40.3 % (ref 36.0–46.0)
Hemoglobin: 12.6 g/dL (ref 12.0–15.0)
MCH: 25.3 pg — ABNORMAL LOW (ref 26.0–34.0)
MCHC: 31.3 g/dL (ref 30.0–36.0)
MCV: 80.8 fL (ref 80.0–100.0)
Platelets: 408 10*3/uL — ABNORMAL HIGH (ref 150–400)
RBC: 4.99 MIL/uL (ref 3.87–5.11)
RDW: 14.3 % (ref 11.5–15.5)
WBC: 9 10*3/uL (ref 4.0–10.5)
nRBC: 0 % (ref 0.0–0.2)

## 2020-09-13 LAB — TROPONIN I (HIGH SENSITIVITY): Troponin I (High Sensitivity): 5 ng/L (ref <18)

## 2020-09-13 NOTE — ED Provider Notes (Signed)
[facility]  ____________________________________________   Event Date/Time   First MD Initiated Contact with Patient 09/13/20 1549     (approximate)  I have reviewed the triage vital signs and the nursing notes.   HISTORY  Chief Complaint Chest Pain and Shortness of Breath    HPI Linda Giles is a 55 y.o. female chronic lower back pain, hypertension, prediabetes who presents with chest pain and shortness of breath.  Symptoms started earlier this morning.  She was in an argument with her son and then pressure on the right side of her chest that was associated with shortness of breath, mainly in her bilateral upper and lower extremities.  It was intermittent and since being in the emergency department has resolved.  Shortness of breath is also resolved.  She denies associated diaphoresis, nausea vomiting.  Symptoms are not worse with exertion.  Patient also notes that over the last 2-week she has had intermittent lightheadedness with standing and walking.  She had a presyncopal episode at work.  She notes that she has had nausea and vomiting as well as diarrhea since having her gallbladder removed several months ago.  Currently she is tolerating p.o.         Past Medical History:  Diagnosis Date   Chronic back pain    Hypertension     Patient Active Problem List   Diagnosis Date Noted   Acute calculous cholecystitis 04/19/2020   Chest pain 02/11/2016    Past Surgical History:  Procedure Laterality Date   ABDOMINAL HYSTERECTOMY     LAPAROSCOPIC CHOLECYSTECTOMY  04/19/2020    Prior to Admission medications   Medication Sig Start Date End Date Taking? Authorizing Provider  acetaminophen (TYLENOL) 500 MG tablet Take 1,000 mg by mouth every 6 (six) hours as needed for moderate pain or headache.    [provider]  cyclobenzaprine (FLEXERIL) 10 MG tablet Take 1 tablet (10 mg total) by mouth 3 (three) times daily as needed. Patient taking  differently: Take 10 mg by mouth 3 (three) times daily as needed for muscle spasms. 01/25/19   Fisher, Roselyn Bering, PA-C  ibuprofen (ADVIL) 600 MG tablet Take 1 tablet (600 mg total) by mouth every 6 (six) hours as needed for fever, headache or moderate pain. Patient not taking: Reported on 05/04/2020 04/20/20   Donovan Kail, PA-C  meloxicam (MOBIC) 15 MG tablet Take 15 mg by mouth daily as needed for pain.    [provider]  Multiple Vitamin (MULTIVITAMIN WITH MINERALS) TABS tablet Take 1 tablet by mouth daily.    [provider]  omeprazole (PRILOSEC OTC) 20 MG tablet Take 20 mg by mouth daily.    [provider]  ondansetron (ZOFRAN-ODT) 4 MG disintegrating tablet Take 1 tablet (4 mg total) by mouth every 6 (six) hours as needed for nausea. 04/20/20   Donovan Kail, PA-C  oxyCODONE (OXY IR/ROXICODONE) 5 MG immediate release tablet Take 1 tablet (5 mg total) by mouth every 4 (four) hours as needed for severe pain or breakthrough pain. 04/20/20   Donovan Kail, PA-C    Allergies Compazine [prochlorperazine edisylate]  Family History  Problem Relation Age of Onset   Heart disease Mother    Hypertension Mother    CVA Mother    Colon cancer Mother    Cervical cancer Mother    Hypertension Sister    Hypertension Brother    Scoliosis Brother     Social History Social History   Tobacco  Use   Smoking status: Never   Smokeless tobacco: Never  Substance Use Topics   Alcohol use: No   Drug use: No    Review of Systems   Review of Systems  Constitutional:  Negative for chills and fever.  Respiratory:  Positive for shortness of breath.   Cardiovascular:  Positive for chest pain.  Gastrointestinal:  Positive for diarrhea, nausea and vomiting. Negative for abdominal pain.  Genitourinary:  Negative for dysuria.  Neurological:  Positive for light-headedness. Negative for weakness, numbness and headaches.  All other systems reviewed and are  negative.  Physical Exam Updated Vital Signs BP (!) 143/97 (BP Location: Left Arm)   Pulse 86   Temp 98.6 F (37 C) (Oral)   Resp 17   Ht 5\' 6"  (1.676 m)   Wt 97.5 kg   SpO2 99%   BMI 34.70 kg/m   Physical Exam Vitals and nursing note reviewed.  Constitutional:      General: She is not in acute distress.    Appearance: Normal appearance.  HENT:     Head: Normocephalic and atraumatic.  Eyes:     General: No scleral icterus.    Conjunctiva/sclera: Conjunctivae normal.  Cardiovascular:     Heart sounds: Normal heart sounds.  No systolic murmur is present.  Pulmonary:     Effort: Pulmonary effort is normal. No respiratory distress.     Breath sounds: No stridor.  Abdominal:     Palpations: Abdomen is soft.     Tenderness: There is no abdominal tenderness. There is no guarding.  Musculoskeletal:        General: No deformity or signs of injury.     Cervical back: Normal range of motion.  Skin:    General: Skin is dry.     Coloration: Skin is not jaundiced or pale.  Neurological:     General: No focal deficit present.     Mental Status: She is alert and oriented to person, place, and time. Mental status is at baseline.  Psychiatric:        Mood and Affect: Mood normal.        Behavior: Behavior normal.     LABS (all labs ordered are listed, but only abnormal results are displayed)  Labs Reviewed  BASIC METABOLIC PANEL - Abnormal; Notable for the following components:      Result Value   Glucose, Bld 132 (*)    All other components within normal limits  CBC - Abnormal; Notable for the following components:   MCH 25.3 (*)    Platelets 408 (*)    All other components within normal limits  POC URINE PREG, ED  TROPONIN I (HIGH SENSITIVITY)   ____________________________________________  EKG  Normal sinus rhythm, normal intervals, normal axis, no acute ischemic changes ____________________________________________  RADIOLOGY , personally  viewed and evaluated these images (plain radiographs) as part of my medical decision making, as well as reviewing the written report by the radiologist.  ED MD interpretation: Chest x-ray reviewed by myself does not show any acute cardiopulmonary process    ____________________________________________   PROCEDURES  Procedure(s) performed (including Critical Care):  Procedures   ____________________________________________   INITIAL IMPRESSION / ASSESSMENT AND PLAN / ED COURSE     Patient is a 55 year old female presenting with a self resolved episode of chest pain dyspnea and paresthesias.  Occurred greater than 3 at hours prior to arrival.  EKG is nonischemic, chest x-ray without any acute cardiopulmonary process and her  troponin is negative.  It is atypical for ACS.  Also low suspicion for PE given her normal vital signs and no risk factors.  Patient also describes some more longstanding lightheadedness.  Denies vertigo and she has no other neurologic symptoms.  Her labs are reassuring, no evidence of dehydration or anemia.  She does have nausea vomiting and diarrhea since having cholecystectomy so possible that she is dehydrated leading to orthostasis and lightheadedness.  We discussed staying hydrated.  I also advised that she follow-up with a primary care physician regarding the symptoms.      ____________________________________________   FINAL CLINICAL IMPRESSION(S) / ED DIAGNOSES  Final diagnoses:  Chest pain, unspecified type     ED Discharge Orders     None        Note:  This document was prepared using Dragon voice recognition software and may include unintentional dictation errors.    Georga Hacking, MD 09/13/20 [phone]

## 2020-09-13 NOTE — ED Triage Notes (Signed)
Pt arrives via pov from home w/ c/o CP starting a couple hours ago. Also c/o blurred vision, dizziness, and tingling in both forearms and hand. Pt reports hx of anxiety and HTN but does not take meds. Was arguing with son this morning but states chest was hurting prior to argument. NAD noted at this time.

## 2020-09-13 NOTE — Discharge Instructions (Addendum)
Your blood work, EKG and chest x-ray were all reassuring today.  If you continue to have lightheadedness, please follow-up with a primary care physician.  Please try to stay hydrated.  Make sure you are standing up slowly.

## 2020-10-21 ENCOUNTER — Encounter: Payer: Self-pay | Admitting: General Surgery

## 2020-10-26 IMAGING — CT CT ANGIO CHEST
2 of 6 series · 18 of 46 positions shown · IV contrast (APPLIED)
Comparison: None.

CLINICAL DATA: Shortness of breath

EXAM:
CT ANGIOGRAPHY CHEST WITH CONTRAST
TECHNIQUE: Multidetector CT imaging of the chest was performed using the
standard protocol during bolus administration of intravenous
contrast. Multiplanar CT image reconstructions and MIPs were
obtained to evaluate the vascular anatomy.
CONTRAST:  100mL OMNIPAQUE IOHEXOL 350 MG/ML SOLN

[Series 6: thins · axial · 0.64mm/px · z∈[-228,+7]mm · 15 of 259 slices shown]
[im 12/259  lung]
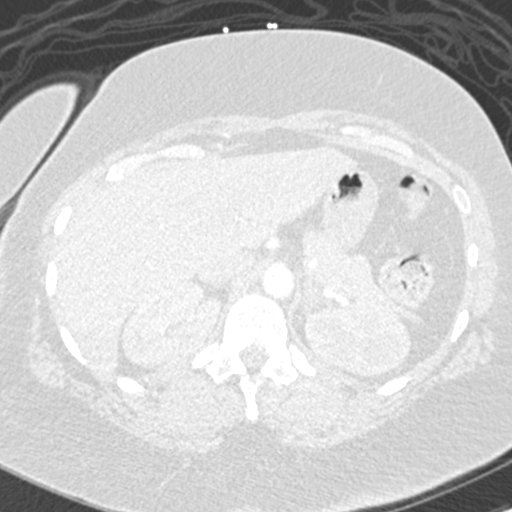
[im 34/259  soft-tissue]
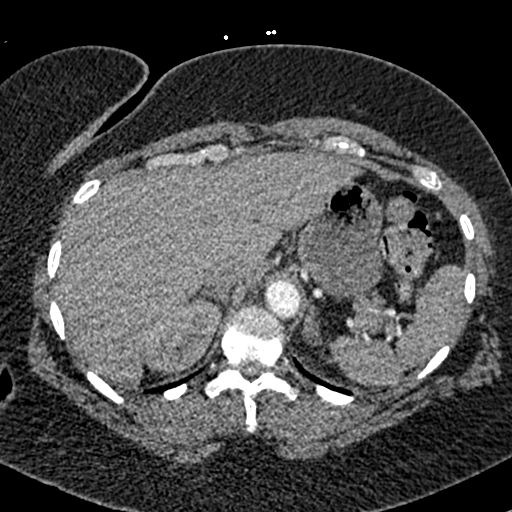
[im 45/259  lung]
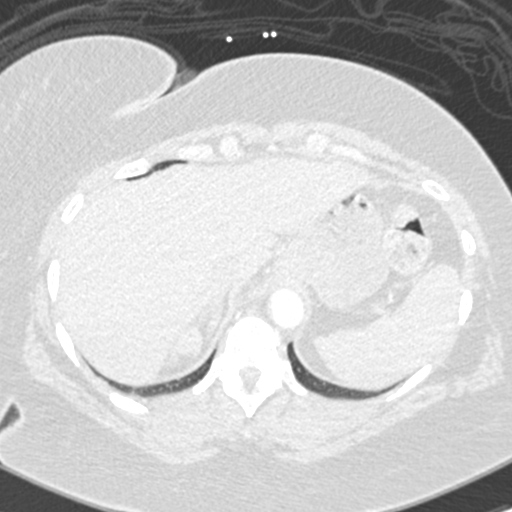
[im 68/259  soft-tissue]
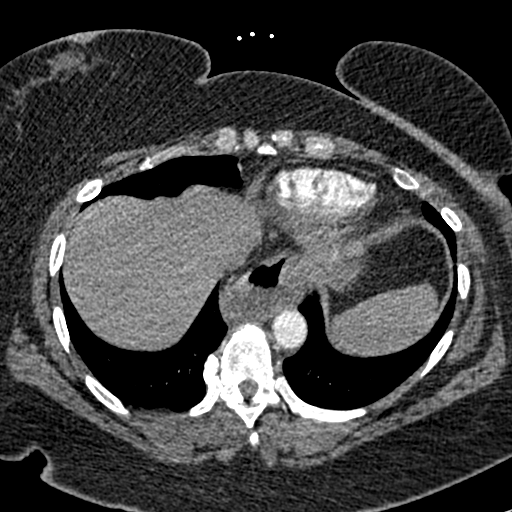
[im 79/259  lung]
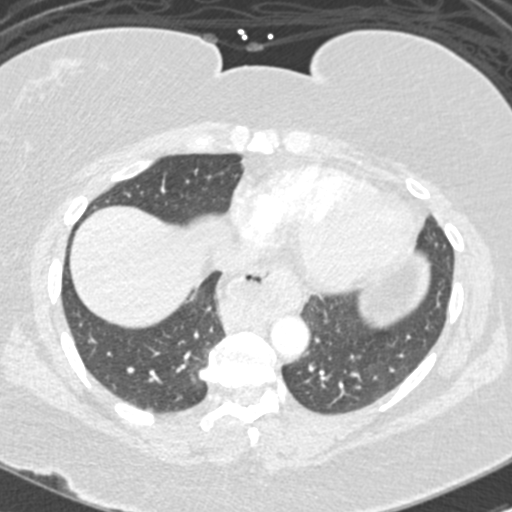
[im 101/259  soft-tissue]
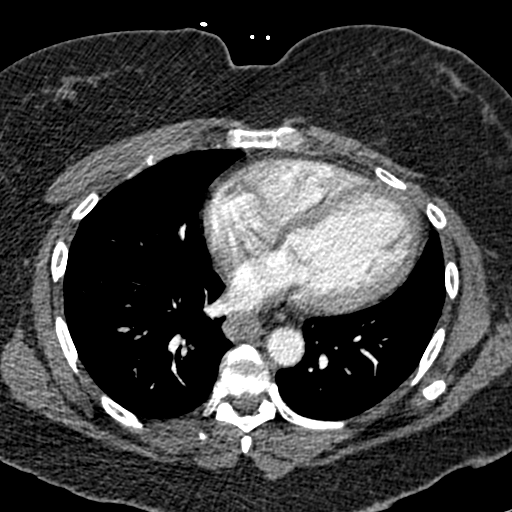
[im 113/259  lung]
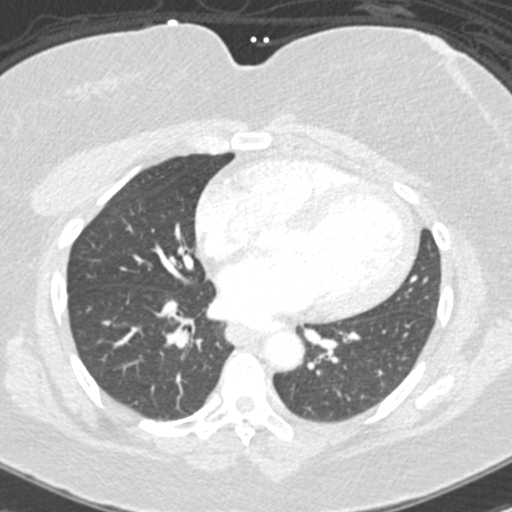
[im 135/259  soft-tissue]
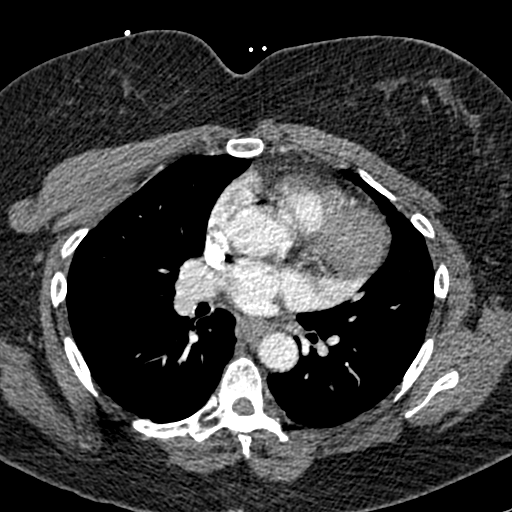
[im 146/259  lung]
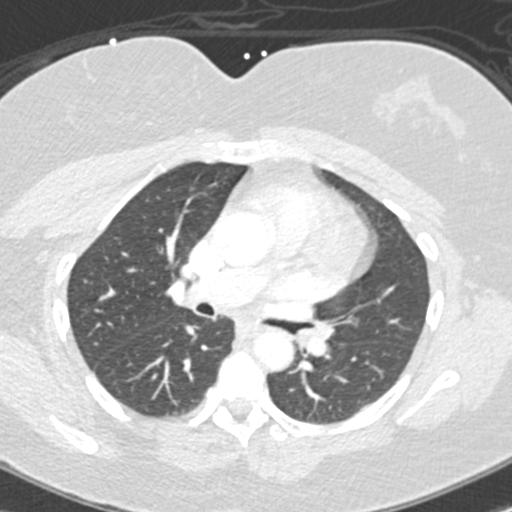
[im 158/259  soft-tissue]
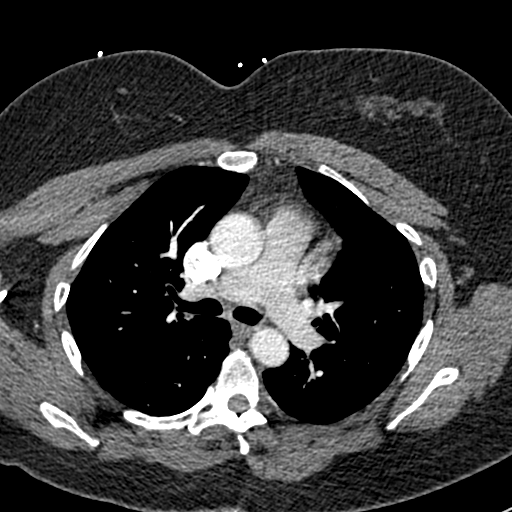
[im 180/259  lung]
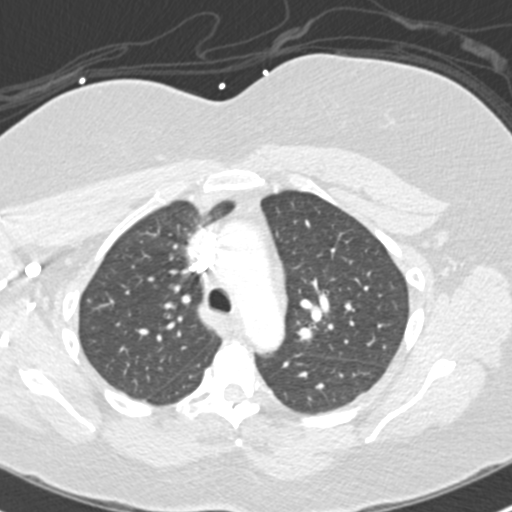
[im 191/259  soft-tissue]
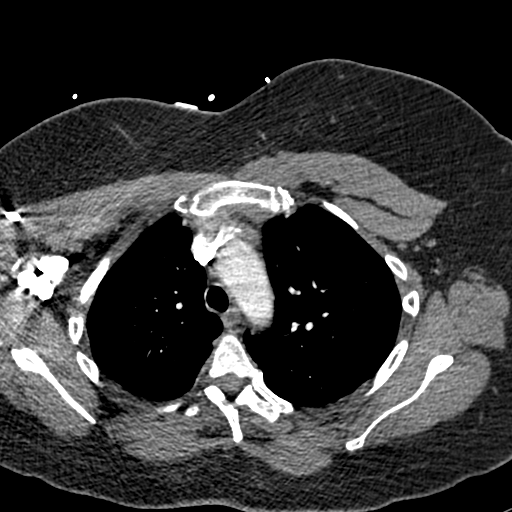
[im 214/259  lung]
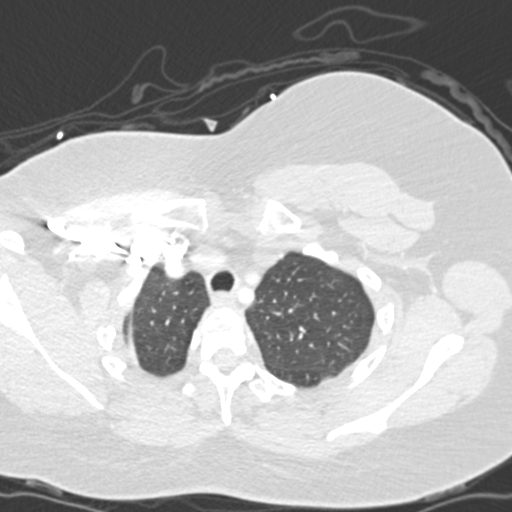
[im 225/259  soft-tissue]
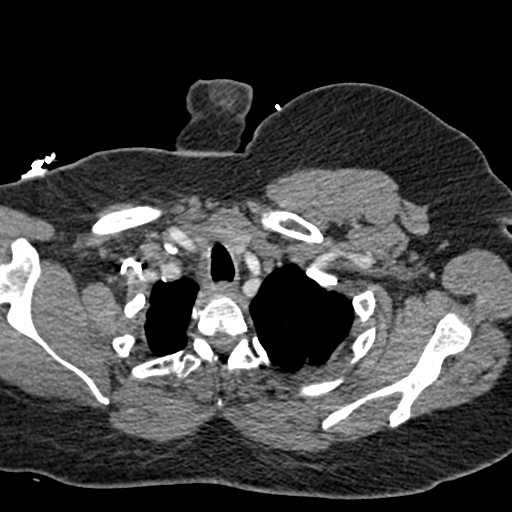
[im 247/259  lung]
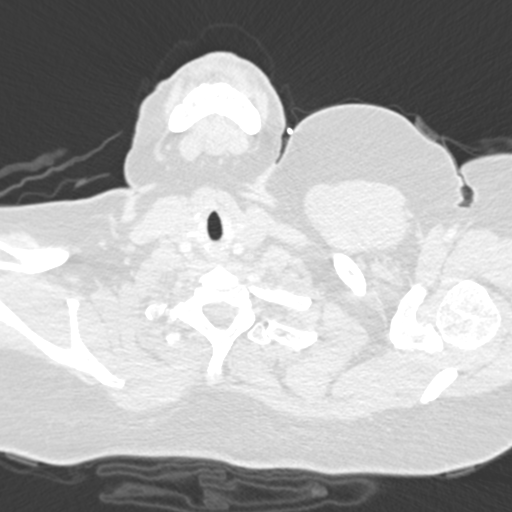

[Series 8: coronal mpr · coronal · 0.51mm/px · 3 of 74 slices shown]
[im 19/74  soft-tissue]
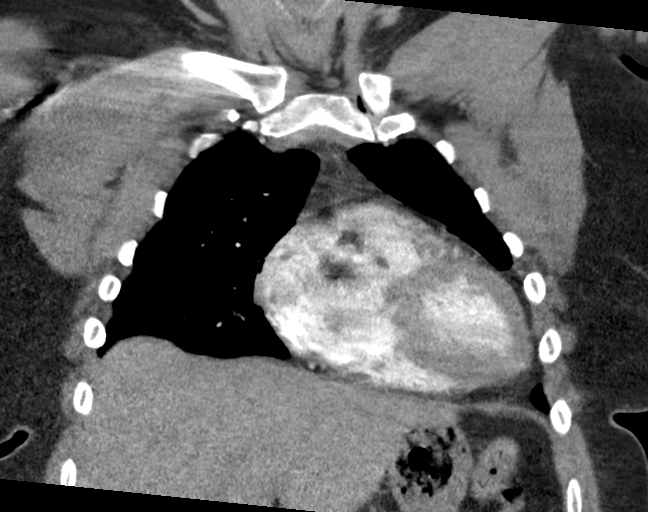
[im 37/74  soft-tissue]
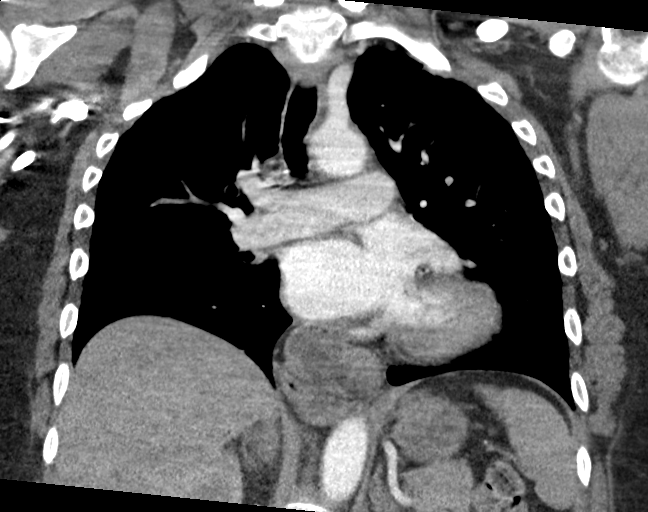
[im 55/74  soft-tissue]
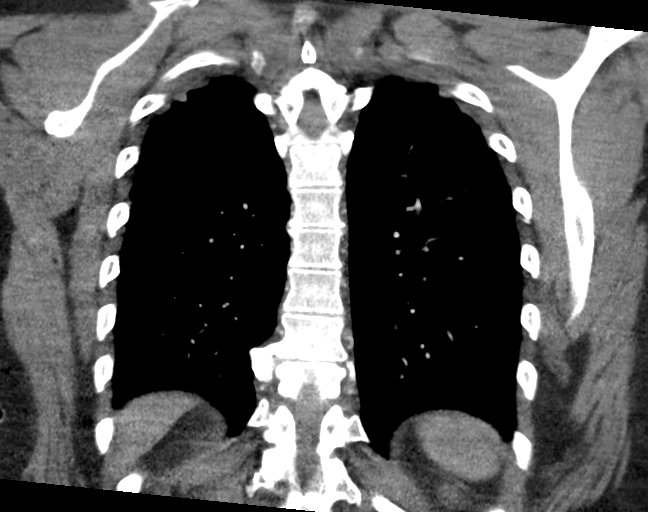

[18 of 46 positions shown; findings below may reference images not displayed]

FINDINGS: Cardiovascular: There is suboptimal opacification of the main
pulmonary artery. However there is no main central or large proximal
segmental pulmonary embolism. There is mild cardiomegaly. No
pericardial effusion or thickening. No evidence right heart strain.
There is normal three-vessel brachiocephalic anatomy without
proximal stenosis. The thoracic aorta is normal in appearance.

Mediastinum/Nodes: No hilar, mediastinal, or axillary adenopathy.
Thyroid gland, trachea, and esophagus demonstrate no significant
findings.

Lungs/Pleura: The lungs are clear. No pleural effusion or
pneumothorax. No airspace consolidation.

Upper Abdomen: A moderate paraesophageal hernia is present.

Musculoskeletal: No chest wall abnormality. No acute or significant
osseous findings.

Review of the MIP images confirms the above findings.
IMPRESSION: Suboptimal opacification of the main pulmonary artery, however no
central or proximal large segmental pulmonary embolism.

Moderate paraesophageal hernia.

No other acute intrathoracic pathology to explain the patient's
symptoms.

## 2021-01-30 ENCOUNTER — Emergency Department: Payer: BC Managed Care – PPO

## 2021-01-30 ENCOUNTER — Emergency Department
Admission: EM | Admit: 2021-01-30 | Discharge: 2021-01-30 | Disposition: A | Discharge status: Home / Self Care | Payer: BC Managed Care – PPO | Attending: Emergency Medicine | Admitting: Emergency Medicine

## 2021-01-30 ENCOUNTER — Other Ambulatory Visit: Payer: Self-pay

## 2021-01-30 DIAGNOSIS — M5416 Radiculopathy, lumbar region: Secondary | ICD-10-CM

## 2021-01-30 DIAGNOSIS — M47817 Spondylosis without myelopathy or radiculopathy, lumbosacral region: Secondary | ICD-10-CM

## 2021-01-30 DIAGNOSIS — M4727 Other spondylosis with radiculopathy, lumbosacral region: Secondary | ICD-10-CM | POA: Insufficient documentation

## 2021-01-30 DIAGNOSIS — M545 Low back pain, unspecified: Secondary | ICD-10-CM | POA: Diagnosis present

## 2021-01-30 MED ORDER — KETOROLAC TROMETHAMINE 60 MG/2ML IM SOLN
60.0000 mg | Freq: Once | INTRAMUSCULAR | Status: AC
  Administered 2021-01-30: 60 mg via INTRAMUSCULAR
  Filled 2021-01-30: qty 2

## 2021-01-30 MED ORDER — MELOXICAM 15 MG PO TABS: 15.0000 mg | ORAL_TABLET | Freq: Every day | ORAL | 1 refills | Status: AC | PRN

## 2021-01-30 MED ORDER — METHOCARBAMOL 500 MG PO TABS: 500.0000 mg | ORAL_TABLET | Freq: Three times a day (TID) | ORAL | 0 refills | Status: AC | PRN

## 2021-01-30 NOTE — Discharge Instructions (Signed)
Your exam and XR confirm some mild-moderate facet arthritis (wear & tear). This can cause midline pain. You also have some right left nerve irritation. Take the prescription meds as directed. Follow-up with your provider for continued symptoms. Return to the ED if needed.

## 2021-01-30 NOTE — ED Provider Notes (Signed)
[facility] Provider Note  Patient Contact: 1:52 PM (approximate)   History   Back Pain   HPI  Linda Giles is a 56 y.o. female presents to the ED for evaluation of right-sided low back pain with some referral into the leg.  Patient reports onset of symptoms this morning.  She denies any preceding injury, trauma, or fall.  She notes some numbness and tingling into the right leg.  He denies any bladder or bowel incontinence, foot drop, or saddle anesthesia.  Patient does have a history of hypertension and chronic back pain.     Physical Exam   Triage Vital Signs: ED Triage Vitals [01/30/21 1139]  Enc Vitals Group     BP (!) 149/69     Pulse Rate 62     Resp 18     Temp 98.6 F (37 C)     Temp Source Oral     SpO2 97 %     Weight      Height      Head Circumference      Peak Flow      Pain Score      Pain Loc      Pain Edu?      Excl. in [place]?     Most recent vital signs: Vitals:   01/30/21 1139  BP: (!) 149/69  Pulse: 62  Resp: 18  Temp: 98.6 F (37 C)  SpO2: 97%     General: Alert and in no acute distress. Cardiovascular:  Good peripheral perfusion Respiratory: Normal respiratory effort without tachypnea or retractions. Lungs CTAB.  Musculoskeletal: Full range of motion to all extremities.  Normal spinal alignment without midline tenderness, spasm, deformity, or step-off.  Mildly tender to palpation along the right lumbosacral junction.  Normal hip flexion and extension range. Neurologic:  No gross focal neurologic deficits are appreciated.  Cranial nerves II to XII grossly intact.  Normal LE DTRs bilaterally. Skin:   No rash noted Other:   ED Results / Procedures / Treatments   Labs (all labs ordered are listed, but only abnormal results are displayed) Labs Reviewed - No data to display   EKG   RADIOLOGY  I personally viewed and evaluated these images as part of my medical decision making, as well as reviewing the  written report by the radiologist.  ED Provider Interpretation: agree with report  DG Lumbar Spine Complete  Result Date: 01/30/2021 CLINICAL DATA:  LOWER back pain with RIGHT LOWER extremity pain. Numbness. No injury. EXAM: LUMBAR SPINE - COMPLETE 4+ VIEW COMPARISON:  None. FINDINGS: There is no evidence of lumbar spine fracture. Alignment is normal. Degenerative changes are present in the LOWER facets. Visualized bowel gas pattern is nonobstructive. IMPRESSION: No evidence for acute  abnormality. Electronically Signed   By: Norva Pavlov M.D.   On: 01/30/2021 14:54    PROCEDURES:  Critical Care performed: No  Procedures   MEDICATIONS ORDERED IN ED: Medications  ketorolac (TORADOL) injection 60 mg (60 mg Intramuscular Given 01/30/21 1412)     IMPRESSION / MDM / ASSESSMENT AND PLAN / ED COURSE  I reviewed the triage vital signs and the nursing notes.                              Differential diagnosis includes, but is not limited to, lumbar strain, lumbar radiculopathy, compression fracture  Patient with ED evaluation of acute right low back pain  with some referral in her left leg, presenting to the ED.  No red flags on exam.  X-rays reviewed by me did not reveal any acute fracture or dislocation.  Patient reports improvement of her symptoms after ED medication administration.  Clinically the patient has symptoms consistent with a likely lumbar strain with some mild lumbar radiculopathy.  Patient will be referred to her PCP for ongoing evaluation management.  Patient will be discharged home with prescriptions for Robaxin and Mobic. Patient is given ED precautions to return to the ED for any worsening or new symptoms.   FINAL CLINICAL IMPRESSION(S) / ED DIAGNOSES   Final diagnoses:  Acute midline low back pain without sciatica  Facet arthritis of lumbosacral region  Lumbar radiculopathy     Rx / DC Orders   ED Discharge Orders          Ordered    meloxicam (MOBIC) 15 MG  tablet  Daily PRN        01/30/21 1542    methocarbamol (ROBAXIN) 500 MG tablet  3 times daily PRN        01/30/21 1542             Note:  This document was prepared using Dragon voice recognition software and may include unintentional dictation errors.    Lissa Hoard, PA-C 01/30/21 1859    Merwyn Katos, MD 01/31/21 [phone]

## 2021-01-30 NOTE — ED Triage Notes (Signed)
Pt c/o right lower back pain that radiates into the leg that started this morning with some numbness, denies injury

## 2021-10-06 ENCOUNTER — Ambulatory Visit (LOCAL_COMMUNITY_HEALTH_CENTER): Payer: Self-pay

## 2021-10-06 DIAGNOSIS — Z111 Encounter for screening for respiratory tuberculosis: Secondary | ICD-10-CM

## 2021-10-09 ENCOUNTER — Ambulatory Visit (LOCAL_COMMUNITY_HEALTH_CENTER): Payer: Self-pay

## 2021-10-09 DIAGNOSIS — Z111 Encounter for screening for respiratory tuberculosis: Secondary | ICD-10-CM

## 2021-10-09 LAB — TB SKIN TEST
Induration: 0 mm
TB Skin Test: NEGATIVE

## 2021-11-18 ENCOUNTER — Other Ambulatory Visit: Payer: Self-pay

## 2021-11-18 ENCOUNTER — Emergency Department
Admission: EM | Admit: 2021-11-18 | Discharge: 2021-11-18 | Disposition: A | Discharge status: Home / Self Care | Payer: BC Managed Care – PPO | Attending: Emergency Medicine | Admitting: Emergency Medicine

## 2021-11-18 DIAGNOSIS — S46912A Strain of unspecified muscle, fascia and tendon at shoulder and upper arm level, left arm, initial encounter: Secondary | ICD-10-CM | POA: Insufficient documentation

## 2021-11-18 DIAGNOSIS — M542 Cervicalgia: Secondary | ICD-10-CM | POA: Insufficient documentation

## 2021-11-18 DIAGNOSIS — M549 Dorsalgia, unspecified: Secondary | ICD-10-CM | POA: Insufficient documentation

## 2021-11-18 DIAGNOSIS — X58XXXA Exposure to other specified factors, initial encounter: Secondary | ICD-10-CM | POA: Insufficient documentation

## 2021-11-18 DIAGNOSIS — S46812A Strain of other muscles, fascia and tendons at shoulder and upper arm level, left arm, initial encounter: Secondary | ICD-10-CM

## 2021-11-18 MED ORDER — CYCLOBENZAPRINE HCL 10 MG PO TABS
10.0000 mg | ORAL_TABLET | Freq: Once | ORAL | Status: AC
  Administered 2021-11-18: 10 mg via ORAL
  Filled 2021-11-18: qty 1

## 2021-11-18 MED ORDER — KETOROLAC TROMETHAMINE 60 MG/2ML IM SOLN
30.0000 mg | Freq: Once | INTRAMUSCULAR | Status: AC
  Administered 2021-11-18: 30 mg via INTRAMUSCULAR
  Filled 2021-11-18: qty 2

## 2021-11-18 MED ORDER — CYCLOBENZAPRINE HCL 10 MG PO TABS: 10.0000 mg | ORAL_TABLET | Freq: Three times a day (TID) | ORAL | 0 refills | Status: DC | PRN

## 2021-11-18 MED ORDER — NAPROXEN 500 MG PO TABS: 500.0000 mg | ORAL_TABLET | Freq: Two times a day (BID) | ORAL | 0 refills | Status: DC

## 2021-11-18 NOTE — ED Triage Notes (Signed)
Pt came by POV. C/o Back pain. Started Monday in L shoulder, radiating to back. Pt has been taking tylenol. Having back spasms this morning, with pain shooting to both legs. Mucjh more painful when turning to L. Started wearing Lido patches Wednesday. Last removed 830pm last night.

## 2021-11-18 NOTE — Discharge Instructions (Signed)
Please follow up with primary care if not improving over the next few days.  Use heating pad and ice in rotation.  Return to the ER for symptoms that change or worsen if unable to schedule an appointment with primary care.

## 2021-11-18 NOTE — ED Provider Notes (Signed)
[facility] Provider Note    Event Date/Time   First MD Initiated Contact with Patient 11/18/21 1450     (approximate)   History   Back Pain   HPI  Linda Giles is a 56 y.o. female presents to the emergency department for treatment and evaluation of neck and back pain. Pain was present upon awakening over a week ago and has not improved with Tylenol or lidocaine patches. No specific injury. Pain started in the left side of her neck and now radiates into her back. Pain worsens with movement.     Physical Exam   Triage Vital Signs: ED Triage Vitals  Enc Vitals Group     BP 11/18/21 1428 (!) 145/73     Pulse Rate 11/18/21 1428 70     Resp 11/18/21 1428 20     Temp 11/18/21 1428 98.3 F (36.8 C)     Temp Source 11/18/21 1428 Oral     SpO2 11/18/21 1428 100 %     Weight 11/18/21 1430 282 lb (127.9 kg)     Height 11/18/21 1429 5' 6.5" (1.689 m)     Head Circumference --      Peak Flow --      Pain Score 11/18/21 1429 10     Pain Loc --      Pain Edu? --      Excl. in [place]? --     Most recent vital signs: Vitals:   11/18/21 1428  BP: (!) 145/73  Pulse: 70  Resp: 20  Temp: 98.3 F (36.8 C)  SpO2: 100%     General: Awake, no distress.  CV:  Good peripheral perfusion.  Resp:  Normal effort.  Abd:  No distention.  Other:  No focal midline tenderness of the thoracic or lumbar spine.   ED Results / Procedures / Treatments   Labs (all labs ordered are listed, but only abnormal results are displayed) Labs Reviewed - No data to display   EKG  Not indicated.   RADIOLOGY  Not indicated.  PROCEDURES:  Critical Care performed: No  Procedures   MEDICATIONS ORDERED IN ED: Medications  ketorolac (TORADOL) injection 30 mg (has no administration in time range)  cyclobenzaprine (FLEXERIL) tablet 10 mg (has no administration in time range)     IMPRESSION / MDM / ASSESSMENT AND PLAN / ED COURSE  I reviewed the triage vital  signs and the nursing notes.                              Differential diagnosis includes, but is not limited to muscle strain, acute torticollis  Patient's presentation is most consistent with acute, uncomplicated illness.  56 year old female presenting to the emergency department for treatment and evaluation neck and back pain.  See HPI for further details.  Toradol and Flexeril ordered while here in the emergency department.  Prescription sent to patient's pharmacy.  She was encouraged to follow-up with her primary care provider.  ER return precautions discussed as well.     FINAL CLINICAL IMPRESSION(S) / ED DIAGNOSES   Final diagnoses:  Trapezius strain, left, initial encounter     Rx / DC Orders   ED Discharge Orders          Ordered    cyclobenzaprine (FLEXERIL) 10 MG tablet  3 times daily PRN        11/18/21 1520    naproxen (NAPROSYN) 500 MG  tablet  2 times daily with meals        11/18/21 1520             Note:  This document was prepared using Dragon voice recognition software and may include unintentional dictation errors.   Chinita Pester, FNP 11/18/21 1529    Jene Every, MD 11/18/21 [phone]

## 2022-03-17 ENCOUNTER — Emergency Department: Payer: Self-pay

## 2022-03-17 ENCOUNTER — Emergency Department
Admission: EM | Admit: 2022-03-17 | Discharge: 2022-03-17 | Disposition: A | Discharge status: Home / Self Care | Payer: Self-pay | Attending: Emergency Medicine | Admitting: Emergency Medicine

## 2022-03-17 ENCOUNTER — Other Ambulatory Visit: Payer: Self-pay

## 2022-03-17 DIAGNOSIS — S8002XA Contusion of left knee, initial encounter: Secondary | ICD-10-CM | POA: Insufficient documentation

## 2022-03-17 DIAGNOSIS — S39012A Strain of muscle, fascia and tendon of lower back, initial encounter: Secondary | ICD-10-CM | POA: Insufficient documentation

## 2022-03-17 DIAGNOSIS — W010XXA Fall on same level from slipping, tripping and stumbling without subsequent striking against object, initial encounter: Secondary | ICD-10-CM | POA: Insufficient documentation

## 2022-03-17 DIAGNOSIS — W19XXXA Unspecified fall, initial encounter: Secondary | ICD-10-CM

## 2022-03-17 MED ORDER — LIDOCAINE 5 % EX PTCH
1.0000 | MEDICATED_PATCH | CUTANEOUS | Status: DC
  Administered 2022-03-17: 1 via TRANSDERMAL
  Filled 2022-03-17: qty 1

## 2022-03-17 NOTE — Discharge Instructions (Signed)
Apply ice to your knee and lower back.  If the Lidoderm chills or pain begin by more of these over-the-counter.

## 2022-03-17 NOTE — ED Provider Notes (Signed)
[facility] Provider Note    Event Date/Time   First MD Initiated Contact with Patient 03/17/22 1125     (approximate)   History   Fall   HPI  Linda Giles is a 57 y.o. female with history of hypertension presents emergency department after a fall this morning.  Patient states she tripped over the dog falling forwards.  States she twisted her back at the same time.  Complaining of left knee and lower back pain.  No numbness or tingling.  No head injury      Physical Exam   Triage Vital Signs: ED Triage Vitals  Enc Vitals Group     BP 03/17/22 1113 (!) 178/88     Pulse Rate 03/17/22 1112 89     Resp 03/17/22 1112 16     Temp 03/17/22 1112 98.1 F (36.7 C)     Temp Source 03/17/22 1112 Oral     SpO2 03/17/22 1112 98 %     Weight 03/17/22 1113 281 lb 15.5 oz (127.9 kg)     Height 03/17/22 1113 5' 6.5" (1.689 m)     Head Circumference --      Peak Flow --      Pain Score 03/17/22 1113 9     Pain Loc --      Pain Edu? --      Excl. in [place]? --     Most recent vital signs: Vitals:   03/17/22 1112 03/17/22 1113  BP:  (!) 178/88  Pulse: 89   Resp: 16   Temp: 98.1 F (36.7 C)   SpO2: 98%      General: Awake, no distress.   CV:  Good peripheral perfusion. regular rate and  rhythm Resp:  Normal effort.  Abd:  No distention.   Other:  Left knee tender at the patella, full range of motion, neurovascular intact, musculature from the T-spine and lumbar spine tender to palpation, bony tenderness more appreciated lumbar spine, 5 or 5 strength lower extremities, patient is able to walk without difficulty   ED Results / Procedures / Treatments   Labs (all labs ordered are listed, but only abnormal results are displayed) Labs Reviewed - No data to display   EKG     RADIOLOGY CT lumbar spine, x-ray left knee    PROCEDURES:   Procedures   MEDICATIONS ORDERED IN ED: Medications  lidocaine (LIDODERM) 5 % 1 patch (has no  administration in time range)     IMPRESSION / MDM / ASSESSMENT AND PLAN / ED COURSE  I reviewed the triage vital signs and the nursing notes.                              Differential diagnosis includes, but is not limited to, contusion, fracture, strain  Patient's presentation is most consistent with acute complicated illness / injury requiring diagnostic workup.   X-ray of the left knee independently reviewed and interpreted by me as being negative for any acute abnormality  CT lumbar spine, I did review the radiologist read.  Interpret this as being negative for any acute abnormality  Patient was placed in a hinged knee brace for comfort.  She is given a Lidoderm patch for lower back pain.  She has muscle relaxers and anti-inflammatories at home.  She does not want additional medication.  She is apply ice to the lower back.  Follow-up with orthopedics if not improving  1 week.  Follow-up with your regular doctor as needed.  Return if worsening.  She was given a work note and discharged in stable condition.  Patient is in agreement treatment plan.      FINAL CLINICAL IMPRESSION(S) / ED DIAGNOSES   Final diagnoses:  Fall, initial encounter  Strain of lumbar region, initial encounter  Contusion of left knee, initial encounter     Rx / DC Orders   ED Discharge Orders     None        Note:  This document was prepared using Dragon voice recognition software and may include unintentional dictation errors.    Versie Starks, PA-C 03/17/22 1232    Naaman Plummer, MD 03/17/22 [phone]

## 2022-03-17 NOTE — ED Triage Notes (Signed)
Pt here with a fall this morning while walking her dog. Pt denies hitting her head, c/o mid back and left knee pain. Pt ambulatory to triage.

## 2022-05-11 ENCOUNTER — Emergency Department: Payer: Self-pay

## 2022-05-11 ENCOUNTER — Encounter: Payer: Self-pay | Admitting: Emergency Medicine

## 2022-05-11 ENCOUNTER — Other Ambulatory Visit: Payer: Self-pay

## 2022-05-11 ENCOUNTER — Emergency Department
Admission: EM | Admit: 2022-05-11 | Discharge: 2022-05-11 | Disposition: A | Discharge status: Home / Self Care | Payer: Self-pay | Attending: Emergency Medicine | Admitting: Emergency Medicine

## 2022-05-11 DIAGNOSIS — I10 Essential (primary) hypertension: Secondary | ICD-10-CM | POA: Insufficient documentation

## 2022-05-11 DIAGNOSIS — R519 Headache, unspecified: Secondary | ICD-10-CM | POA: Insufficient documentation

## 2022-05-11 MED ORDER — SODIUM CHLORIDE 0.9 % IV BOLUS
1000.0000 mL | Freq: Once | INTRAVENOUS | Status: AC
  Administered 2022-05-11: 1000 mL via INTRAVENOUS

## 2022-05-11 MED ORDER — DIPHENHYDRAMINE HCL 50 MG/ML IJ SOLN
25.0000 mg | Freq: Once | INTRAMUSCULAR | Status: AC
  Administered 2022-05-11: 25 mg via INTRAVENOUS
  Filled 2022-05-11: qty 1

## 2022-05-11 MED ORDER — KETOROLAC TROMETHAMINE 15 MG/ML IJ SOLN: 15.0000 mg | Freq: Once | INTRAMUSCULAR | Status: DC

## 2022-05-11 MED ORDER — METOCLOPRAMIDE HCL 5 MG/ML IJ SOLN
10.0000 mg | Freq: Once | INTRAMUSCULAR | Status: AC
  Administered 2022-05-11: 10 mg via INTRAVENOUS
  Filled 2022-05-11: qty 2

## 2022-05-11 MED ORDER — MAGNESIUM SULFATE 2 GM/50ML IV SOLN
2.0000 g | Freq: Once | INTRAVENOUS | Status: AC
  Administered 2022-05-11: 2 g via INTRAVENOUS
  Filled 2022-05-11: qty 50

## 2022-05-11 MED ORDER — ACETAMINOPHEN 500 MG PO TABS
1000.0000 mg | ORAL_TABLET | Freq: Once | ORAL | Status: AC
  Administered 2022-05-11: 1000 mg via ORAL
  Filled 2022-05-11: qty 2

## 2022-05-11 MED ORDER — KETOROLAC TROMETHAMINE 15 MG/ML IJ SOLN
15.0000 mg | Freq: Once | INTRAMUSCULAR | Status: AC | PRN
  Administered 2022-05-11: 15 mg via INTRAVENOUS
  Filled 2022-05-11: qty 1

## 2022-05-11 NOTE — Discharge Instructions (Signed)
Take acetaminophen 650 mg and ibuprofen 400 mg every 6 hours for pain.  Take with food.  See your doctor this week for follow-up appointment.  If you experience any new, worsening, unexpected symptoms come back to the emergency department for reevaluation.

## 2022-05-11 NOTE — ED Triage Notes (Signed)
Patient ambulatory to triage with steady gait, without difficulty or distress noted; pt reports frontal HA x 3 days, pain to rt side of face; st hx migraines in the past

## 2022-05-11 NOTE — ED Provider Notes (Signed)
[facility] Provider Note    Event Date/Time   First MD Initiated Contact with Patient 05/11/22 [phone]     (approximate)   History   Headache   HPI  Linda Giles is a 57 y.o. female   Past medical history of hypertension, chronic back pain, migraine headaches who presents to the emergency department with right-sided headache.  Also involves the frontal area.  Atraumatic.  Started 3 days ago and waxes and wanes with no obvious alleviating or exacerbating factors.  Feels similar to migraines though slightly different and that it involves the frontal area as well, has not had a migraine headache for 5 years.  Photophobia.  No vision changes.  No other acute medical complaints   External Medical Documents Reviewed: Reviewed multiple emergency department visits in years prior for chronic back pain      Physical Exam   Triage Vital Signs: ED Triage Vitals  Enc Vitals Group     BP 05/11/22 0416 (!) 157/74     Pulse Rate 05/11/22 0416 90     Resp 05/11/22 0416 18     Temp 05/11/22 0416 98.4 F (36.9 C)     Temp Source 05/11/22 0416 Oral     SpO2 05/11/22 0416 99 %     Weight 05/11/22 0413 245 lb (111.1 kg)     Height 05/11/22 0413 5\' 6"  (1.676 m)     Head Circumference --      Peak Flow --      Pain Score 05/11/22 0413 10     Pain Loc --      Pain Edu? --      Excl. in [place]? --     Most recent vital signs: Vitals:   05/11/22 0416  BP: (!) 157/74  Pulse: 90  Resp: 18  Temp: 98.4 F (36.9 C)  SpO2: 99%    General: Awake, no distress.  CV:  Good peripheral perfusion.  Resp:  Normal effort.  Abd:  No distention.  Other:  Awake alert comfortable no facial asymmetry or dysarthria no visual changes no temporal tenderness no focal deficits on motor or sensory exam, neck is supple with full range of motion and her hemodynamics are appropriate and reassuring aside from mild hypertension   ED Results / Procedures / Treatments   Labs (all  labs ordered are listed, but only abnormal results are displayed) Labs Reviewed - No data to display  RADIOLOGY I independently reviewed and interpreted CT of the head see no obvious bleeding or midline shift   PROCEDURES:  Critical Care performed: No  Procedures   MEDICATIONS ORDERED IN ED: Medications  magnesium sulfate IVPB 2 g 50 mL (2 g Intravenous New Bag/Given 05/11/22 0433)  ketorolac (TORADOL) 15 MG/ML injection 15 mg (has no administration in time range)  metoCLOPramide (REGLAN) injection 10 mg (10 mg Intravenous Given 05/11/22 0435)  acetaminophen (TYLENOL) tablet 1,000 mg (1,000 mg Oral Given 05/11/22 0436)  sodium chloride 0.9 % bolus 1,000 mL (1,000 mLs Intravenous New Bag/Given 05/11/22 0433)  diphenhydrAMINE (BENADRYL) injection 25 mg (25 mg Intravenous Given 05/11/22 0434)    IMPRESSION / MDM / ASSESSMENT AND PLAN / ED COURSE  I reviewed the triage vital signs and the nursing notes.                                Patient's presentation is most consistent with acute presentation with potential  threat to life or bodily function.  Differential diagnosis includes, but is not limited to, migraine headache, intracranial bleeding, temporal arteritis   The patient is on the cardiac monitor to evaluate for evidence of arrhythmia and/or significant heart rate changes.  MDM: Is a patient with a history of migraine headache most suspicious for migraine headache.  Atraumatic doubt intracranial bleeding, however given self-reported different quality from her previous migraines and not having had a migraine for 5 years we will get a CT head to rule out intracranial bleeding.  Doubt CVA given no focal neurologic deficits and doubt temporal arteritis given no vision changes or temporal tenderness.  Migraine cocktail ordered.  Reassess if symptoms improvement and CT negative plan will be for discharge home with PMD follow-up       FINAL CLINICAL IMPRESSION(S) / ED DIAGNOSES    Final diagnoses:  Acute nonintractable headache, unspecified headache type     Rx / DC Orders   ED Discharge Orders     None        Note:  This document was prepared using Dragon voice recognition software and may include unintentional dictation errors.    Pilar Jarvis, MD 05/11/22 [phone]

## 2022-08-01 ENCOUNTER — Emergency Department: Payer: Self-pay

## 2022-08-01 ENCOUNTER — Emergency Department
Admission: EM | Admit: 2022-08-01 | Discharge: 2022-08-01 | Disposition: A | Discharge status: Home / Self Care | Payer: Self-pay | Attending: Emergency Medicine | Admitting: Emergency Medicine

## 2022-08-01 ENCOUNTER — Other Ambulatory Visit: Payer: Self-pay

## 2022-08-01 DIAGNOSIS — M25511 Pain in right shoulder: Secondary | ICD-10-CM | POA: Insufficient documentation

## 2022-08-01 DIAGNOSIS — M79602 Pain in left arm: Secondary | ICD-10-CM | POA: Insufficient documentation

## 2022-08-01 DIAGNOSIS — M5412 Radiculopathy, cervical region: Secondary | ICD-10-CM | POA: Insufficient documentation

## 2022-08-01 DIAGNOSIS — R079 Chest pain, unspecified: Secondary | ICD-10-CM | POA: Insufficient documentation

## 2022-08-01 DIAGNOSIS — M25512 Pain in left shoulder: Secondary | ICD-10-CM | POA: Insufficient documentation

## 2022-08-01 DIAGNOSIS — M79601 Pain in right arm: Secondary | ICD-10-CM | POA: Insufficient documentation

## 2022-08-01 DIAGNOSIS — R112 Nausea with vomiting, unspecified: Secondary | ICD-10-CM | POA: Insufficient documentation

## 2022-08-01 LAB — CBC
HCT: 38.7 % (ref 36.0–46.0)
Hemoglobin: 11.7 g/dL — ABNORMAL LOW (ref 12.0–15.0)
MCH: 24.3 pg — ABNORMAL LOW (ref 26.0–34.0)
MCHC: 30.2 g/dL (ref 30.0–36.0)
MCV: 80.5 fL (ref 80.0–100.0)
Platelets: 421 10*3/uL — ABNORMAL HIGH (ref 150–400)
RBC: 4.81 MIL/uL (ref 3.87–5.11)
RDW: 14.4 % (ref 11.5–15.5)
WBC: 9.8 10*3/uL (ref 4.0–10.5)
nRBC: 0 % (ref 0.0–0.2)

## 2022-08-01 LAB — HEPATIC FUNCTION PANEL
ALT: 13 U/L (ref 0–44)
AST: 11 U/L — ABNORMAL LOW (ref 15–41)
Albumin: 3.8 g/dL (ref 3.5–5.0)
Alkaline Phosphatase: 64 U/L (ref 38–126)
Bilirubin, Direct: <0.1 mg/dL (ref 0.0–0.2)
Total Bilirubin: 0.3 mg/dL (ref 0.3–1.2)
Total Protein: 7.6 g/dL (ref 6.5–8.1)

## 2022-08-01 LAB — BASIC METABOLIC PANEL
Anion gap: 7 (ref 5–15)
BUN: 17 mg/dL (ref 6–20)
CO2: 26 mmol/L (ref 22–32)
Calcium: 9.1 mg/dL (ref 8.9–10.3)
Chloride: 103 mmol/L (ref 98–111)
Creatinine, Ser: 0.95 mg/dL (ref 0.44–1.00)
GFR, Estimated: >60 mL/min (ref >60)
Glucose, Bld: 103 mg/dL — ABNORMAL HIGH (ref 70–99)
Potassium: 3.7 mmol/L (ref 3.5–5.1)
Sodium: 136 mmol/L (ref 135–145)

## 2022-08-01 LAB — TROPONIN I (HIGH SENSITIVITY)
Troponin I (High Sensitivity): 5 ng/L (ref <18)
Troponin I (High Sensitivity): 5 ng/L (ref <18)

## 2022-08-01 LAB — LIPASE, BLOOD: Lipase: 35 U/L (ref 11–51)

## 2022-08-01 MED ORDER — METHOCARBAMOL 500 MG PO TABS: 500.0000 mg | ORAL_TABLET | Freq: Four times a day (QID) | ORAL | 0 refills | Status: DC

## 2022-08-01 MED ORDER — DEXAMETHASONE SODIUM PHOSPHATE 10 MG/ML IJ SOLN
10.0000 mg | Freq: Once | INTRAMUSCULAR | Status: AC
  Administered 2022-08-01: 10 mg via INTRAMUSCULAR
  Filled 2022-08-01: qty 1

## 2022-08-01 MED ORDER — PREDNISONE 50 MG PO TABS: 50.0000 mg | ORAL_TABLET | Freq: Every day | ORAL | 0 refills | Status: DC

## 2022-08-01 MED ORDER — DIAZEPAM 2 MG PO TABS
2.0000 mg | ORAL_TABLET | Freq: Once | ORAL | Status: AC
  Administered 2022-08-01: 2 mg via ORAL
  Filled 2022-08-01: qty 1

## 2022-08-01 NOTE — ED Notes (Signed)
 US at bedside

## 2022-08-01 NOTE — ED Provider Notes (Signed)
[facility] Provider Note  Patient Contact: 8:44 PM (approximate)   History   Chest Pain   HPI  Linda Giles is a 57 y.o. female who presents emergency department complaining of neck/bilateral shoulder/arm/chest pain.  Patient states that she was at work, felt a sharp pain in her neck, both arms had pain shooting down and then she developed central chest pain.  Her neck no longer hurts, denies any shortness of breath.  During the interview patient endorsed that she had some nausea vomiting this morning which does not not seem like it is completely atypical for the patient.  That she took some Zofran and the symptoms went away.  Denied any abdominal pain at this time.  No urinary complaints.  No diarrhea or constipation.  Patient denies fevers chills.  No trauma to the chest or neck.     Physical Exam   Triage Vital Signs: ED Triage Vitals  Encounter Vitals Group     BP 08/01/22 1847 (!) 173/71     Systolic BP Percentile --      Diastolic BP Percentile --      Pulse Rate 08/01/22 1847 (!) 55     Resp 08/01/22 1847 16     Temp 08/01/22 1847 98.2 F (36.8 C)     Temp Source 08/01/22 1847 Oral     SpO2 08/01/22 1847 97 %     Weight 08/01/22 1849 185 lb (83.9 kg)     Height 08/01/22 1849 5\' 8"  (1.727 m)     Head Circumference --      Peak Flow --      Pain Score 08/01/22 1849 5     Pain Loc --      Pain Education --      Exclude from Growth Chart --     Most recent vital signs: Vitals:   08/01/22 1847  BP: (!) 173/71  Pulse: (!) 55  Resp: 16  Temp: 98.2 F (36.8 C)  SpO2: 97%     General: Alert and in no acute distress. Head: No acute traumatic findings  Neck: No stridor. No cervical spine tenderness to palpation. Hematological/Lymphatic/Immunilogical: No cervical lymphadenopathy. Cardiovascular:  Good peripheral perfusion Respiratory: Normal respiratory effort without tachypnea or retractions. Lungs CTAB. Good air entry to the bases  with no decreased or absent breath sounds. Gastrointestinal: Bowel sounds 4 quadrants. Soft and nontender to palpation. No guarding or rigidity. No palpable masses. No distention. No CVA tenderness. Musculoskeletal: Full range of motion to all extremities.  Neurologic:  No gross focal neurologic deficits are appreciated.  Skin:   No rash noted Other:   ED Results / Procedures / Treatments   Labs (all labs ordered are listed, but only abnormal results are displayed) Labs Reviewed  BASIC METABOLIC PANEL - Abnormal; Notable for the following components:      Result Value   Glucose, Bld 103 (*)    All other components within normal limits  CBC - Abnormal; Notable for the following components:   Hemoglobin 11.7 (*)    MCH 24.3 (*)    Platelets 421 (*)    All other components within normal limits  HEPATIC FUNCTION PANEL - Abnormal; Notable for the following components:   AST 11 (*)    All other components within normal limits  LIPASE, BLOOD  TROPONIN I (HIGH SENSITIVITY)  TROPONIN I (HIGH SENSITIVITY)     EKG  ED ECG REPORT I, Delorise Royals Joud Ingwersen,  personally viewed and interpreted  this ECG.   Date: 08/01/2022  EKG Time: 1847 hrs.  Rate: 56 bpm  Rhythm: unchanged from previous tracings, sinus bradycardia  Axis: Normal axis  Intervals:none  ST&T Change: No ST elevation or depression noted  Normal sinus rhythm.  No STEMI.    RADIOLOGY  I personally viewed, evaluated, and interpreted these images as part of my medical decision making, as well as reviewing the written report by the radiologist.  ED Provider Interpretation: No acute finding on chest x-ray or ultrasound  US Abdomen Limited RUQ (LIVER/GB)  Result Date: 08/01/2022 CLINICAL DATA:  History of prior cholecystectomy, presenting with right upper quadrant pain. EXAM: ULTRASOUND ABDOMEN LIMITED RIGHT UPPER QUADRANT COMPARISON:  April 18, 2020 FINDINGS: Gallbladder: The gallbladder is surgically absent. Common bile  duct: Diameter: 9.0 mm Liver: No focal lesion identified. Diffusely increased echogenicity of the liver parenchyma is noted. Portal vein is patent on color Doppler imaging with normal direction of blood flow towards the liver. Other: None. IMPRESSION: 1. Findings consistent with history of prior cholecystectomy. 2. Hepatic steatosis without focal liver lesions. Electronically Signed   By: Aram Candela M.D.   On: 08/01/2022 21:33   DG Chest 2 View  Result Date: 08/01/2022 CLINICAL DATA:  Chest pain EXAM: CHEST - 2 VIEW COMPARISON:  09/13/2020 FINDINGS: The heart size and mediastinal contours are within normal limits. Both lungs are clear. The visualized skeletal structures are unremarkable. IMPRESSION: No active cardiopulmonary disease. Electronically Signed   By: Jasmine Pang M.D.   On: 08/01/2022 19:23    PROCEDURES:  Critical Care performed: No  Procedures   MEDICATIONS ORDERED IN ED: Medications  dexamethasone (DECADRON) injection 10 mg (has no administration in time range)  diazepam (VALIUM) tablet 2 mg (has no administration in time range)     IMPRESSION / MDM / ASSESSMENT AND PLAN / ED COURSE  I reviewed the triage vital signs and the nursing notes.                                 Differential diagnosis includes, but is not limited to, ACS/STEMI, pericarditis, endocarditis, pneumothorax, cervical radiculopathy, rib fracture, GERD, biliary obstruction, pancreatitis  Patient's presentation is most consistent with acute presentation with potential threat to life or bodily function.   Patient's diagnosis is consistent with cervical radiculopathy.  Patient presents emergency department after developing neck pain that ran down both arms, then subsequently chest pain.  Patient has no cardiac history.  Cardiac workup with troponin, EKG, chest x-ray is reassuring.  Overall exam is reassuring, given her symptoms I suspected that this was likely cervical but on exam she did have some  right upper quadrant tenderness.  She does have a previous cholecystectomy.  Added hepatic function panel and ultrasound to ensure no evidence of bile duct dilation, or other concerning findings in the right upper quadrant.  Labs, imaging, EKG are reassuring.  Given the fact that symptoms started in her neck I again suspect that this is likely cervical in nature.  Will treat symptomatically with steroid and muscle relaxer.  Follow-up with neurosurgery as needed.  Follow-up with cardiology as needed.  Return precautions discussed with the patient..  Patient is given ED precautions to return to the ED for any worsening or new symptoms.     FINAL CLINICAL IMPRESSION(S) / ED DIAGNOSES   Final diagnoses:  Cervical radiculopathy  Nonspecific chest pain     Rx / DC Orders  ED Discharge Orders          Ordered    predniSONE (DELTASONE) 50 MG tablet  Daily with breakfast        08/01/22 2158    methocarbamol (ROBAXIN) 500 MG tablet  4 times daily        08/01/22 2158             Note:  This document was prepared using Dragon voice recognition software and may include unintentional dictation errors.   Lanette Hampshire 08/01/22 2159    Minna Antis, MD 08/02/22 Ernestina [place]

## 2022-08-01 NOTE — ED Notes (Signed)
Patient verbalizes understanding of the risk of driving after taking ER ordered medications. Patient states sister is taking her home

## 2022-08-01 NOTE — ED Triage Notes (Signed)
Pt arrives from work via [facility] for chest pain x30 minutes. Pt reports pain is in center of chest, "feels like something is sitting there". Pt reports pain is radiating up to neck down left arm. Pt reports some R sided arm numbness. Pt reports x1 episode of emesis. Pt denies previous cardiac hx.   324 asa given en route

## 2022-08-01 NOTE — ED Notes (Signed)
First Nurse Note: Patient to ED via [facility] from work for CP. Describes pain as mid-sternal pressure. Radiates into both shoulders with right arm tingling. Given 324mg  Asprin with EMS.  BP 148/84

## 2022-08-14 NOTE — Progress Notes (Deleted)
Referring Physician:  Minna Antis, MD [address] [place],  [place] [address]  Primary Physician:  Pcp, No  History of Present Illness: 08/14/2022*** Linda Giles has a history of ***  Seen in ED on 08/01/22 with neck and bilateral arm pain. She had some chest pain as well. Cardiac workup done in ED.      Given prednisone and robaxin from ED.     Duration: *** Location: *** Quality: *** Severity: ***  Precipitating: aggravated by *** Modifying factors: made better by *** Weakness: none Timing: *** Bowel/Bladder Dysfunction: none  Conservative measures:  Physical therapy: ***  Multimodal medical therapy including regular antiinflammatories: prednisone, robaxin  Injections: *** epidural steroid injections  Past Surgery: ***  Linda Giles has ***no symptoms of cervical myelopathy.  The symptoms are causing a significant impact on the patient's life.   Review of Systems:  A 10 point review of systems is negative, except for the pertinent positives and negatives detailed in the HPI.  Past Medical History: Past Medical History:  Diagnosis Date   Chronic back pain    Hypertension     Past Surgical History: Past Surgical History:  Procedure Laterality Date   ABDOMINAL HYSTERECTOMY     LAPAROSCOPIC CHOLECYSTECTOMY  04/19/2020    Allergies: Allergies as of 08/28/2022 - Review Complete 08/01/2022  Allergen Reaction Noted   Compazine [prochlorperazine edisylate] Swelling 08/30/2015    Medications: Outpatient Encounter Medications as of 08/28/2022  Medication Sig   acetaminophen (TYLENOL) 500 MG tablet Take 1,000 mg by mouth every 6 (six) hours as needed for moderate pain or headache.   cyclobenzaprine (FLEXERIL) 10 MG tablet Take 1 tablet (10 mg total) by mouth 3 (three) times daily as needed.   methocarbamol (ROBAXIN) 500 MG tablet Take 1 tablet (500 mg total) by mouth 4 (four) times daily.   Multiple Vitamin (MULTIVITAMIN WITH  MINERALS) TABS tablet Take 1 tablet by mouth daily.   naproxen (NAPROSYN) 500 MG tablet Take 1 tablet (500 mg total) by mouth 2 (two) times daily with a meal.   omeprazole (PRILOSEC OTC) 20 MG tablet Take 20 mg by mouth daily.   ondansetron (ZOFRAN-ODT) 4 MG disintegrating tablet Take 1 tablet (4 mg total) by mouth every 6 (six) hours as needed for nausea.   predniSONE (DELTASONE) 50 MG tablet Take 1 tablet (50 mg total) by mouth daily with breakfast.   No facility-administered encounter medications on file as of 08/28/2022.    Social History: Social History   Tobacco Use   Smoking status: Never   Smokeless tobacco: Never  Substance Use Topics   Alcohol use: No   Drug use: No    Family Medical History: Family History  Problem Relation Age of Onset   Heart disease Mother    Hypertension Mother    CVA Mother    Colon cancer Mother    Cervical cancer Mother    Hypertension Sister    Hypertension Brother    Scoliosis Brother     Physical Examination: There were no vitals filed for this visit.  General: Patient is well developed, well nourished, calm, collected, and in no apparent distress. Attention to examination is appropriate.  Respiratory: Patient is breathing without any difficulty.   NEUROLOGICAL:     Awake, alert, oriented to person, place, and time.  Speech is clear and fluent. Fund of knowledge is appropriate.   Cranial Nerves: Pupils equal round and reactive to light.  Facial tone is symmetric.    ***  ROM of cervical spine *** pain *** posterior cervical tenderness. *** tenderness in bilateral trapezial region.   *** ROM of lumbar spine *** pain *** posterior lumbar tenderness.   No abnormal lesions on exposed skin.   Strength: Side Biceps Triceps Deltoid Interossei Grip Wrist Ext. Wrist Flex.  R 5 5 5 5 5 5 5   L 5 5 5 5 5 5 5    Side Iliopsoas Quads Hamstring PF DF EHL  R 5 5 5 5 5 5   L 5 5 5 5 5 5    Reflexes are ***2+ and symmetric at the biceps,  brachioradialis, patella and achilles.   Hoffman's is absent.  Clonus is not present.   Bilateral upper and lower extremity sensation is intact to light touch.     Gait is normal.   ***No difficulty with tandem gait.    Medical Decision Making  Imaging: Cervical xrays dated 01/25/19:  FINDINGS: Stable straightening of cervical lordosis. Normal prevertebral soft tissue contour. Cervicothoracic junction alignment is within normal limits. Normal C1-C2 alignment and joint spaces. AP alignment remains normal. 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 No acute osseous abnormality identified. Negative visible upper chest.   IMPRESSION: 1. No acute osseous abnormality identified in the cervical spine. 2. 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     Electronically Signed   By: Odessa Fleming M.D.   On: 01/25/2019 22:14  I have personally reviewed the images and agree with the above interpretation.  Assessment and Plan: Linda Giles is a pleasant 57 y.o. female has ***  Treatment options discussed with patient and following plan made:   - Order for physical therapy for *** spine ***. Patient to call to schedule appointment. *** - Continue current medications including ***. Reviewed dosing and side effects.  - Prescription for ***. Reviewed dosing and side effects. Take with food.  - Prescription for *** to take prn muscle spasms. Reviewed dosing and side effects. Discussed this can cause drowsiness.  - MRI of *** to further evaluate *** radiculopathy. No improvement time or medications (***).  - Referral to [facility] at [facility] to discuss possible *** injections.  - Will schedule phone visit to review MRI results once I get them back.   I spent a total of *** minutes in face-to-face and non-face-to-face activities related to this patient's care today including review of outside records, review of imaging, review of symptoms,  physical exam, discussion of differential diagnosis, discussion of treatment options, and documentation.   Thank you for involving me in the care of this patient.   Drake Leach PA-C Dept. of Neurosurgery

## 2022-08-28 ENCOUNTER — Ambulatory Visit: Payer: Self-pay | Admitting: Orthopedic Surgery

## 2022-09-03 NOTE — Progress Notes (Signed)
Referring Physician:  Minna Antis, MD [address] [place],  [place] [address]  Primary Physician:  Pcp, No  History of Present Illness: 09/13/2022 [name] has a history of HTN.   Seen in ED on 08/01/22 with neck and bilateral arm pain. She had some chest pain as well. Cardiac workup done in ED.   History of chronic constant neck pain x years that is getting worse. She has constant neck pain that radiates into both arms to her hands. She notes numbness and tingling in both arms. She notes some weakness in both arms. No specific aggravating or alleviating factors.   Given prednisone and robaxin from ED with mild relief. No relief with flexeril or naproxen.   Bowel/Bladder Dysfunction: none  Conservative measures:  Physical therapy: has not participated in, did see a chiropractor, without relief.  Multimodal medical therapy including regular antiinflammatories: prednisone, robaxin, flexeril, naproxen, tylenol Injections: has not received epidural steroid injections  Past Surgery: none  Linda Giles has no symptoms of cervical myelopathy.  The symptoms are causing a significant impact on the patient's life.   Review of Systems:  A 10 point review of systems is negative, except for the pertinent positives and negatives detailed in the HPI.  Past Medical History: Past Medical History:  Diagnosis Date   Chronic back pain    Hypertension     Past Surgical History: Past Surgical History:  Procedure Laterality Date   ABDOMINAL HYSTERECTOMY     LAPAROSCOPIC CHOLECYSTECTOMY  04/19/2020    Allergies: Allergies as of 09/13/2022 - Review Complete 08/01/2022  Allergen Reaction Noted   Compazine [prochlorperazine edisylate] Swelling 08/30/2015    Medications: Outpatient Encounter Medications as of 09/13/2022  Medication Sig   acetaminophen (TYLENOL) 500 MG tablet Take 1,000 mg by mouth every 6 (six) hours as needed for moderate pain or headache.    cyclobenzaprine (FLEXERIL) 10 MG tablet Take 1 tablet (10 mg total) by mouth 3 (three) times daily as needed.   methocarbamol (ROBAXIN) 500 MG tablet Take 1 tablet (500 mg total) by mouth 4 (four) times daily.   Multiple Vitamin (MULTIVITAMIN WITH MINERALS) TABS tablet Take 1 tablet by mouth daily.   naproxen (NAPROSYN) 500 MG tablet Take 1 tablet (500 mg total) by mouth 2 (two) times daily with a meal.   omeprazole (PRILOSEC OTC) 20 MG tablet Take 20 mg by mouth daily.   ondansetron (ZOFRAN-ODT) 4 MG disintegrating tablet Take 1 tablet (4 mg total) by mouth every 6 (six) hours as needed for nausea.   predniSONE (DELTASONE) 50 MG tablet Take 1 tablet (50 mg total) by mouth daily with breakfast.   No facility-administered encounter medications on file as of 09/13/2022.    Social History: Social History   Tobacco Use   Smoking status: Never   Smokeless tobacco: Never  Substance Use Topics   Alcohol use: No   Drug use: No    Family Medical History: Family History  Problem Relation Age of Onset   Heart disease Mother    Hypertension Mother    CVA Mother    Colon cancer Mother    Cervical cancer Mother    Hypertension Sister    Hypertension Brother    Scoliosis Brother     Physical Examination: There were no vitals filed for this visit.  General: Patient is well developed, well nourished, calm, collected, and in no apparent distress. Attention to examination is appropriate.  Respiratory: Patient is breathing without any difficulty.  NEUROLOGICAL:     Awake, alert, oriented to person, place, and time.  Speech is clear and fluent. Fund of knowledge is appropriate.   Cranial Nerves: Pupils equal round and reactive to light.  Facial tone is symmetric.    Lower posterior cervical tenderness. Diffuse tenderness in bilateral trapezial region.   Good ROM of shoulders with mild pain of left shoulder.   No abnormal lesions on exposed skin.   Negative tinels at wrist and elbow  bilaterally.   Negative phalens at the wrist bilaterally.   Strength: Side Biceps Triceps Deltoid Interossei Grip Wrist Ext. Wrist Flex.  R 5 5 5 5 5 5 5   L 5 5 5 5 5 5 5    Side Iliopsoas Quads Hamstring PF DF EHL  R 5 5 5 5 5 5   L 5 5 5 5 5 5    Reflexes are 2+ and symmetric at the biceps, brachioradialis, patella and achilles.   Hoffman's is absent.  Clonus is not present.   Bilateral upper and lower extremity sensation is intact to light touch.     Gait is normal.    Medical Decision Making  Imaging: Cervical xrays dated 01/25/19:  FINDINGS: Stable straightening of cervical lordosis. Normal prevertebral soft tissue contour. Cervicothoracic junction alignment is within normal limits. Normal C1-C2 alignment and joint spaces. AP alignment remains normal. 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 No acute osseous abnormality identified. Negative visible upper chest.   IMPRESSION: 1. No acute osseous abnormality identified in the cervical spine. 2. 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     Electronically Signed   By: Linda Giles M.D.   On: 01/25/2019 22:14  I have personally reviewed the images and agree with the above interpretation.  Assessment and Plan: Linda Giles is a pleasant 57 y.o. female has chronic constant neck pain x years that is getting worse. She has constant neck pain that radiates into both arms to her hands. She notes numbness and tingling in both arms. She notes some weakness in both arms.   Previous cervical xrays from 2021 showed mild cervical spondylosis and DDD C4-C6.   Treatment options discussed with patient and following plan made:   - MRI of cervical spine ordered to evaluate cervical radiculopathy.  - Her brother and father were recently diagnosed with scoliosis and she is very concerned about this. Scoliosis xrays ordered.  - Depending on results of imaging, may  consider PT and/or injections.  - Will schedule follow up visit to review MRI/xray results once I get them back.   I spent a total of 30 minutes in face-to-face and non-face-to-face activities related to this patient's care today including review of outside records, review of imaging, review of symptoms, physical exam, discussion of differential diagnosis, discussion of treatment options, and documentation.   Thank you for involving me in the care of this patient.   Drake Leach PA-C Dept. of Neurosurgery

## 2022-09-13 ENCOUNTER — Telehealth: Payer: Self-pay | Admitting: Orthopedic Surgery

## 2022-09-13 ENCOUNTER — Ambulatory Visit (INDEPENDENT_AMBULATORY_CARE_PROVIDER_SITE_OTHER): Payer: Self-pay | Admitting: Orthopedic Surgery

## 2022-09-13 ENCOUNTER — Encounter: Payer: Self-pay | Admitting: Orthopedic Surgery

## 2022-09-13 VITALS — BP 124/78 | Ht 68.0 in | Wt 252.4 lb

## 2022-09-13 DIAGNOSIS — M5412 Radiculopathy, cervical region: Secondary | ICD-10-CM

## 2022-09-13 DIAGNOSIS — M5030 Other cervical disc degeneration, unspecified cervical region: Secondary | ICD-10-CM

## 2022-09-13 DIAGNOSIS — M50321 Other cervical disc degeneration at C4-C5 level: Secondary | ICD-10-CM

## 2022-09-13 DIAGNOSIS — M47812 Spondylosis without myelopathy or radiculopathy, cervical region: Secondary | ICD-10-CM

## 2022-09-13 DIAGNOSIS — M4722 Other spondylosis with radiculopathy, cervical region: Secondary | ICD-10-CM

## 2022-09-13 DIAGNOSIS — M50322 Other cervical disc degeneration at C5-C6 level: Secondary | ICD-10-CM

## 2022-09-13 NOTE — Telephone Encounter (Signed)
I ordered an MRI for her. She states she is getting new insurance. Going from [employer] to [employer]. She is not sure when it switches and does not have a card yet.

## 2022-09-13 NOTE — Telephone Encounter (Signed)
Noted. I have added this to the referral message.

## 2022-09-13 NOTE — Patient Instructions (Signed)
It was so nice to see you today. Thank you so much for coming in.    Your neck xrays from back in 2021 showed some wear and tear (arthritis). This may be causing your neck and arm pain.   I want to get an MRI of your neck to look into things further. We will get this approved through your insurance and [facility] will call you to schedule the appointment.   MRI will be at [facility] (building with the white pillars) that is [place] off of [address]. The address is [address] [place], [place] [address].   I ordered xrays of your entire spine to check for scoliosis. You can get these at [facility]. Walk into the front and ask for radiology. You do not need any appointment.   After you have the MRI and xrays, it takes 10-14 days for me to get the results back. Once I have them, we will call you to schedule a follow up visit with me to review them.   Please do not hesitate to call if you have any questions or concerns. You can also message me in MyChart.   Drake Leach PA-C [phone]

## 2023-02-06 ENCOUNTER — Emergency Department
Admission: EM | Admit: 2023-02-06 | Discharge: 2023-02-06 | Disposition: A | Discharge status: Home / Self Care | Payer: BLUE CROSS/BLUE SHIELD | Attending: Emergency Medicine | Admitting: Emergency Medicine

## 2023-02-06 ENCOUNTER — Encounter: Payer: Self-pay | Admitting: Emergency Medicine

## 2023-02-06 ENCOUNTER — Emergency Department: Payer: BLUE CROSS/BLUE SHIELD

## 2023-02-06 ENCOUNTER — Other Ambulatory Visit: Payer: Self-pay

## 2023-02-06 DIAGNOSIS — K449 Diaphragmatic hernia without obstruction or gangrene: Secondary | ICD-10-CM | POA: Insufficient documentation

## 2023-02-06 DIAGNOSIS — K529 Noninfective gastroenteritis and colitis, unspecified: Secondary | ICD-10-CM

## 2023-02-06 DIAGNOSIS — R1030 Lower abdominal pain, unspecified: Secondary | ICD-10-CM | POA: Diagnosis present

## 2023-02-06 HISTORY — DX: Other gastritis without bleeding: K29.60

## 2023-02-06 LAB — CBC
HCT: 42.1 % (ref 36.0–46.0)
Hemoglobin: 12.8 g/dL (ref 12.0–15.0)
MCH: 24.4 pg — ABNORMAL LOW (ref 26.0–34.0)
MCHC: 30.4 g/dL (ref 30.0–36.0)
MCV: 80.3 fL (ref 80.0–100.0)
Platelets: 435 10*3/uL — ABNORMAL HIGH (ref 150–400)
RBC: 5.24 MIL/uL — ABNORMAL HIGH (ref 3.87–5.11)
RDW: 14.1 % (ref 11.5–15.5)
WBC: 10.2 10*3/uL (ref 4.0–10.5)
nRBC: 0 % (ref 0.0–0.2)

## 2023-02-06 LAB — URINALYSIS, ROUTINE W REFLEX MICROSCOPIC
Bilirubin Urine: NEGATIVE
Glucose, UA: NEGATIVE mg/dL
Ketones, ur: NEGATIVE mg/dL
Nitrite: NEGATIVE
Protein, ur: NEGATIVE mg/dL
Specific Gravity, Urine: 1.023 (ref 1.005–1.030)
pH: 6 (ref 5.0–8.0)

## 2023-02-06 LAB — COMPREHENSIVE METABOLIC PANEL
ALT: 13 U/L (ref 0–44)
AST: 16 U/L (ref 15–41)
Albumin: 3.9 g/dL (ref 3.5–5.0)
Alkaline Phosphatase: 60 U/L (ref 38–126)
Anion gap: 7 (ref 5–15)
BUN: 14 mg/dL (ref 6–20)
CO2: 26 mmol/L (ref 22–32)
Calcium: 9.1 mg/dL (ref 8.9–10.3)
Chloride: 103 mmol/L (ref 98–111)
Creatinine, Ser: 0.88 mg/dL (ref 0.44–1.00)
GFR, Estimated: >60 mL/min (ref >60)
Glucose, Bld: 126 mg/dL — ABNORMAL HIGH (ref 70–99)
Potassium: 3.7 mmol/L (ref 3.5–5.1)
Sodium: 136 mmol/L (ref 135–145)
Total Bilirubin: 0.6 mg/dL (ref 0.0–1.2)
Total Protein: 8.1 g/dL (ref 6.5–8.1)

## 2023-02-06 LAB — LIPASE, BLOOD: Lipase: 31 U/L (ref 11–51)

## 2023-02-06 MED ORDER — MORPHINE SULFATE (PF) 4 MG/ML IV SOLN
4.0000 mg | Freq: Once | INTRAVENOUS | Status: AC
  Administered 2023-02-06: 4 mg via INTRAVENOUS
  Filled 2023-02-06: qty 1

## 2023-02-06 MED ORDER — ONDANSETRON HCL 4 MG/2ML IJ SOLN
4.0000 mg | Freq: Once | INTRAMUSCULAR | Status: AC
  Administered 2023-02-06: 4 mg via INTRAVENOUS
  Filled 2023-02-06: qty 2

## 2023-02-06 MED ORDER — ONDANSETRON 4 MG PO TBDP: 4.0000 mg | ORAL_TABLET | Freq: Three times a day (TID) | ORAL | 0 refills | Status: AC | PRN

## 2023-02-06 MED ORDER — SODIUM CHLORIDE 0.9 % IV SOLN: Freq: Once | INTRAVENOUS | Status: AC

## 2023-02-06 MED ORDER — DICYCLOMINE HCL 10 MG/ML IM SOLN
20.0000 mg | Freq: Once | INTRAMUSCULAR | Status: AC
  Administered 2023-02-06: 20 mg via INTRAMUSCULAR
  Filled 2023-02-06: qty 2

## 2023-02-06 MED ORDER — DICYCLOMINE HCL 10 MG PO CAPS: 10.0000 mg | ORAL_CAPSULE | Freq: Three times a day (TID) | ORAL | 0 refills | Status: AC

## 2023-02-06 MED ORDER — IOHEXOL 350 MG/ML SOLN
100.0000 mL | Freq: Once | INTRAVENOUS | Status: AC | PRN
  Administered 2023-02-06: 100 mL via INTRAVENOUS

## 2023-02-06 NOTE — ED Triage Notes (Addendum)
Vomiting since yesterday. Also c/o lower abd pain. Denies diarrhea.  AAOx3.  Skin warm and dry. NAD  States get a bitter taste in mouth just before vomiting.  Has hx of GERD, states "I take medicine for that"

## 2023-02-06 NOTE — ED Provider Notes (Signed)
[facility] Provider Note    Event Date/Time   First MD Initiated Contact with Patient 02/06/23 1646     (approximate)   History   Vomiting   HPI  Linda Giles is a 58 y.o. female with a history of a cholecystectomy, oophorectomy who presents with complaints of lower abdominal pain nausea and vomiting for approximately 2 days.  She denies diarrhea.  No sick contacts reported.  No fevers     Physical Exam   Triage Vital Signs: ED Triage Vitals  Encounter Vitals Group     BP 02/06/23 1421 (!) 147/89     Systolic BP Percentile --      Diastolic BP Percentile --      Pulse Rate 02/06/23 1421 (!) 59     Resp 02/06/23 1421 16     Temp 02/06/23 1421 98.7 F (37.1 C)     Temp Source 02/06/23 1421 Oral     SpO2 02/06/23 1421 99 %     Weight 02/06/23 1419 114.5 kg (252 lb 6.8 oz)     Height --      Head Circumference --      Peak Flow --      Pain Score 02/06/23 1419 0     Pain Loc --      Pain Education --      Exclude from Growth Chart --     Most recent vital signs: Vitals:   02/06/23 1421 02/06/23 1710  BP: (!) 147/89 124/79  Pulse: (!) 59 80  Resp: 16 17  Temp: 98.7 F (37.1 C)   SpO2: 99% 99%     General: Awake, no distress.  CV:  Good peripheral perfusion.  Resp:  Normal effort.  Abd:  No distention.  Mild tenderness primarily in the right lower quadrant Other:     ED Results / Procedures / Treatments   Labs (all labs ordered are listed, but only abnormal results are displayed) Labs Reviewed  COMPREHENSIVE METABOLIC PANEL - Abnormal; Notable for the following components:      Result Value   Glucose, Bld 126 (*)    All other components within normal limits  CBC - Abnormal; Notable for the following components:   RBC 5.24 (*)    MCH 24.4 (*)    Platelets 435 (*)    All other components within normal limits  URINALYSIS, ROUTINE W REFLEX MICROSCOPIC - Abnormal; Notable for the following components:   Color, Urine  YELLOW (*)    APPearance HAZY (*)    Hgb urine dipstick SMALL (*)    Leukocytes,Ua SMALL (*)    Bacteria, UA RARE (*)    All other components within normal limits  LIPASE, BLOOD     EKG     RADIOLOGY     PROCEDURES:  Critical Care performed:   Procedures   MEDICATIONS ORDERED IN ED: Medications  iohexol (OMNIPAQUE) 350 MG/ML injection 100 mL (has no administration in time range)  0.9 %  sodium chloride infusion ( Intravenous New Bag/Given 02/06/23 1716)  ondansetron (ZOFRAN) injection 4 mg (4 mg Intravenous Given 02/06/23 1709)  morphine (PF) 4 MG/ML injection 4 mg (4 mg Intravenous Given 02/06/23 1709)     IMPRESSION / MDM / ASSESSMENT AND PLAN / ED COURSE  I reviewed the triage vital signs and the nursing notes. Patient's presentation is most consistent with acute presentation with potential threat to life or bodily function.  Patient presents with abdominal pain, nausea vomiting, differential  includes appendicitis, diverticulitis, gastroenteritis.  Will treat with IV morphine, IV Zofran, IV fluids obtain CT abdomen pelvis and reevaluate, lab work reviewed and is overall reassuring.        FINAL CLINICAL IMPRESSION(S) / ED DIAGNOSES   Final diagnoses:  None     Rx / DC Orders   ED Discharge Orders     None        Note:  This document was prepared using Dragon voice recognition software and may include unintentional dictation errors.   Jene Every, MD 02/06/23 [phone]

## 2023-02-06 NOTE — ED Notes (Signed)
..  The patient is A&OX4, ambulatory at d/c with independent steady gait, NAD. Pt verbalized understanding of d/c instructions, prescriptions and follow up care.  

## 2023-02-10 ENCOUNTER — Emergency Department (HOSPITAL_COMMUNITY)
Admission: EM | Admit: 2023-02-10 | Discharge: 2023-02-11 | Disposition: A | Discharge status: Home / Self Care | Payer: BLUE CROSS/BLUE SHIELD | Attending: Emergency Medicine | Admitting: Emergency Medicine

## 2023-02-10 ENCOUNTER — Other Ambulatory Visit: Payer: Self-pay

## 2023-02-10 DIAGNOSIS — D72829 Elevated white blood cell count, unspecified: Secondary | ICD-10-CM | POA: Diagnosis not present

## 2023-02-10 DIAGNOSIS — I10 Essential (primary) hypertension: Secondary | ICD-10-CM | POA: Insufficient documentation

## 2023-02-10 DIAGNOSIS — R112 Nausea with vomiting, unspecified: Secondary | ICD-10-CM | POA: Diagnosis present

## 2023-02-10 DIAGNOSIS — R197 Diarrhea, unspecified: Secondary | ICD-10-CM | POA: Diagnosis not present

## 2023-02-10 DIAGNOSIS — R1030 Lower abdominal pain, unspecified: Secondary | ICD-10-CM | POA: Insufficient documentation

## 2023-02-10 LAB — COMPREHENSIVE METABOLIC PANEL
ALT: 16 U/L (ref 0–44)
AST: 14 U/L — ABNORMAL LOW (ref 15–41)
Albumin: 3.9 g/dL (ref 3.5–5.0)
Alkaline Phosphatase: 67 U/L (ref 38–126)
Anion gap: 10 (ref 5–15)
BUN: 13 mg/dL (ref 6–20)
CO2: 25 mmol/L (ref 22–32)
Calcium: 9.2 mg/dL (ref 8.9–10.3)
Chloride: 102 mmol/L (ref 98–111)
Creatinine, Ser: 0.93 mg/dL (ref 0.44–1.00)
GFR, Estimated: >60 mL/min (ref >60)
Glucose, Bld: 131 mg/dL — ABNORMAL HIGH (ref 70–99)
Potassium: 3.6 mmol/L (ref 3.5–5.1)
Sodium: 137 mmol/L (ref 135–145)
Total Bilirubin: 0.7 mg/dL (ref 0.0–1.2)
Total Protein: 8.3 g/dL — ABNORMAL HIGH (ref 6.5–8.1)

## 2023-02-10 LAB — CBC
HCT: 43.2 % (ref 36.0–46.0)
Hemoglobin: 12.9 g/dL (ref 12.0–15.0)
MCH: 23.9 pg — ABNORMAL LOW (ref 26.0–34.0)
MCHC: 29.9 g/dL — ABNORMAL LOW (ref 30.0–36.0)
MCV: 80.1 fL (ref 80.0–100.0)
Platelets: 338 10*3/uL (ref 150–400)
RBC: 5.39 MIL/uL — ABNORMAL HIGH (ref 3.87–5.11)
RDW: 14.4 % (ref 11.5–15.5)
WBC: 10.9 10*3/uL — ABNORMAL HIGH (ref 4.0–10.5)
nRBC: 0 % (ref 0.0–0.2)

## 2023-02-10 LAB — LIPASE, BLOOD: Lipase: 24 U/L (ref 11–51)

## 2023-02-10 MED ORDER — SODIUM CHLORIDE 0.9 % IV BOLUS
1000.0000 mL | Freq: Once | INTRAVENOUS | Status: AC
  Administered 2023-02-11: 1000 mL via INTRAVENOUS

## 2023-02-10 NOTE — ED Triage Notes (Signed)
Patient to ED by POV with c/o ABD pain and bloody stools. She states she was seen for same complaints 2-3 weeks ago given Bently and Zofran with no relief. Bloody stools started yesterday.

## 2023-02-11 LAB — POC OCCULT BLOOD, ED: Fecal Occult Bld: NEGATIVE

## 2023-02-11 MED ORDER — PANTOPRAZOLE SODIUM 40 MG IV SOLR
40.0000 mg | Freq: Once | INTRAVENOUS | Status: AC
  Administered 2023-02-11: 40 mg via INTRAVENOUS
  Filled 2023-02-11: qty 10

## 2023-02-11 MED ORDER — FAMOTIDINE IN NACL 20-0.9 MG/50ML-% IV SOLN
20.0000 mg | INTRAVENOUS | Status: AC
  Administered 2023-02-11: 20 mg via INTRAVENOUS
  Filled 2023-02-11: qty 50

## 2023-02-11 MED ORDER — METOCLOPRAMIDE HCL 5 MG/ML IJ SOLN
10.0000 mg | INTRAMUSCULAR | Status: AC
  Administered 2023-02-11: 10 mg via INTRAVENOUS
  Filled 2023-02-11: qty 2

## 2023-02-11 MED ORDER — KETOROLAC TROMETHAMINE 15 MG/ML IJ SOLN
15.0000 mg | Freq: Once | INTRAMUSCULAR | Status: AC
  Administered 2023-02-11: 15 mg via INTRAVENOUS
  Filled 2023-02-11: qty 1

## 2023-02-11 NOTE — ED Provider Notes (Signed)
[place] EMERGENCY DEPARTMENT AT [facility] Provider Note   CSN: [id number] Arrival date & time: 02/10/23  1736     History  No chief complaint on file.   Linda Giles is a 58 y.o. female.  15 female with a history of hypertension, reflux gastritis, chronic back pain presents for nausea, vomiting, diarrhea.  She states that symptoms have been going on for over 1 week.  She has had difficulty tolerating food and fluids by mouth secondary to emesis.  Has tried 1.5 bottles of Pepto-Bismol since symptoms began without relief.  Did start noticing that her stool was darker in color.  She has had diarrhea which has been intermittent.  Was evaluated at [facility] days ago for these complaints with a reassuring abdominal CT scan.  Was prescribed Bentyl and Zofran, but states these medications have not been helping her.  She has not had any fevers, hematemesis, hematochezia, questionable food ingestion, sick contacts.  Abdominal surgical history significant for abdominal hysterectomy as well as cholecystectomy. Has never been seen by GI or undergone routine colonoscopy despite reporting hx of rectal CA in mother.  The history is provided by the patient. No language interpreter was used.       Home Medications Prior to Admission medications   Medication Sig Start Date End Date Taking? Authorizing Provider  acetaminophen (TYLENOL) 500 MG tablet Take 1,000 mg by mouth every 6 (six) hours as needed for moderate pain or headache.    [provider]  dicyclomine (BENTYL) 10 MG capsule Take 1 capsule (10 mg total) by mouth 4 (four) times daily -  before meals and at bedtime. 02/06/23   Jene Every, MD  Multiple Vitamin (MULTIVITAMIN WITH MINERALS) TABS tablet Take 1 tablet by mouth daily.    [provider]  omeprazole (PRILOSEC OTC) 20 MG tablet Take 20 mg by mouth daily.    [provider]  ondansetron (ZOFRAN-ODT) 4 MG disintegrating tablet Take 1 tablet (4  mg total) by mouth every 8 (eight) hours as needed for nausea or vomiting. 02/06/23   Jene Every, MD      Allergies    Compazine [prochlorperazine edisylate]    Review of Systems   Review of Systems Ten systems reviewed and are negative for acute change, except as noted in the HPI.    Physical Exam Updated Vital Signs BP (!) 154/80   Pulse 65   Temp 98.4 F (36.9 C) (Oral)   Resp 16   Ht 5\' 6"  (1.676 m)   Wt 130.2 kg   SpO2 100%   BMI 46.32 kg/m   Physical Exam Vitals and nursing note reviewed.  Constitutional:      General: She is not in acute distress.    Appearance: She is well-developed. She is not diaphoretic.     Comments: Nontoxic appearing and in NAD  HENT:     Head: Normocephalic and atraumatic.  Eyes:     General: No scleral icterus.    Conjunctiva/sclera: Conjunctivae normal.  Cardiovascular:     Rate and Rhythm: Normal rate and regular rhythm.     Pulses: Normal pulses.  Pulmonary:     Effort: Pulmonary effort is normal. No respiratory distress.     Comments: Respirations even and unlabored Abdominal:     Palpations: Abdomen is soft. There is no mass.     Tenderness: There is no guarding.     Comments: Soft, obese abdomen. TTP across the lower abdomen without focal tenderness. No  guarding or peritoneal signs.  Musculoskeletal:        General: Normal range of motion.     Cervical back: Normal range of motion.  Skin:    General: Skin is warm and dry.     Coloration: Skin is not pale.     Findings: No erythema or rash.  Neurological:     Mental Status: She is alert and oriented to person, place, and time.     Coordination: Coordination normal.     Comments: Ambulatory with steady gait.  Psychiatric:        Behavior: Behavior normal.     ED Results / Procedures / Treatments   Labs (all labs ordered are listed, but only abnormal results are displayed) Labs Reviewed  COMPREHENSIVE METABOLIC PANEL - Abnormal; Notable for the following  components:      Result Value   Glucose, Bld 131 (*)    Total Protein 8.3 (*)    AST 14 (*)    All other components within normal limits  CBC - Abnormal; Notable for the following components:   WBC 10.9 (*)    RBC 5.39 (*)    MCH 23.9 (*)    MCHC 29.9 (*)    All other components within normal limits  LIPASE, BLOOD  POC OCCULT BLOOD, ED    EKG None  Radiology No results found.  Procedures Procedures    Medications Ordered in ED Medications  sodium chloride 0.9 % bolus 1,000 mL (0 mLs Intravenous Stopped 02/11/23 0239)  metoCLOPramide (REGLAN) injection 10 mg (10 mg Intravenous Given 02/11/23 0026)  famotidine (PEPCID) IVPB 20 mg premix (0 mg Intravenous Stopped 02/11/23 0239)  pantoprazole (PROTONIX) injection 40 mg (40 mg Intravenous Given 02/11/23 0026)  ketorolac (TORADOL) 15 MG/ML injection 15 mg (15 mg Intravenous Given 02/11/23 0315)    ED Course/ Medical Decision Making/ A&P Clinical Course as of 02/11/23 0344  Mon Feb 11, 2023  0254 Hemoccult negative. Suspect stool darkening to be related to Pepto-Bismol usage. [KH]  0306 Patient feeling better. No acute complaints. No bowel movement since roomed. Will d/c GI pathogen panel and C diff screen. [KH]    Clinical Course User Index [KH] Antony Madura, PA-C                                 Medical Decision Making Amount and/or Complexity of Data Reviewed Labs: ordered.  Risk Prescription drug management.   This patient presents to the ED for concern of N/V/D, this involves an extensive number of treatment options, and is a complaint that carries with it a high risk of complications and morbidity.  The differential diagnosis includes viral illness vs food-borne illness vs pSBO/SBO vs IBS vs gastritis vs opiate withdrawal   Co morbidities that complicate the patient evaluation  HTN Gastritis Chronic back pain   Additional history obtained:  External records from outside source obtained and reviewed including  CT abd/pelvis from 02/06/23 which was negative for acute process.   Lab Tests:  I Ordered, and personally interpreted labs.  The pertinent results include:  WBC 10.9. Otherwise, CBC, CMP, lipase are normal. Hemoccult negative.   Cardiac Monitoring:  The patient was maintained on a cardiac monitor.  I personally viewed and interpreted the cardiac monitored which showed an underlying rhythm of: NSR   Medicines ordered and prescription drug management:  I ordered medication including Reglan for nausea, Pepcid and Protonix for pain and  vomiting, Toradol for pain  Reevaluation of the patient after these medicines showed that the patient improved I have reviewed the patients home medicines and have made adjustments as needed   Test Considered:  Gi pathogen panel   Problem List / ED Course:  Ongoing symptoms of N/V/D for >1 week. Clinically well appearing without signs of acute surgical abdomen. Did have CT completed 5 days ago which was negative. Since this imaging, no fever or significant leukocytosis. No electrolyte derangement. Liver and kidney function preserved. Doubt emergent infectious etiology. Symptoms improved in the ED with supportive care. No vomiting or ongoing diarrhea. Endorses recurrent GI issues over the years, but has never seen a specialist. Provided referral to gastroenterology. Is overdue for routine colonoscopy screening.   Reevaluation:  After the interventions noted above, I reevaluated the patient and found that they have :improved   Social Determinants of Health:  Lives independently   Dispostion:  After consideration of the diagnostic results and the patients response to treatment, I feel that the patent would benefit from ongoing supportive care. Recommended addition of a probiotic for diarrheal management. Encouraged PCP f/u, adequate PO fluid intake. Return precautions discussed and provided. Patient discharged in stable condition with no  unaddressed concerns.          Final Clinical Impression(s) / ED Diagnoses Final diagnoses:  Nausea vomiting and diarrhea    Rx / DC Orders ED Discharge Orders     None         Antony Madura, PA-C 02/11/23 0352    Nira Conn, MD 02/11/23 [phone]

## 2023-02-11 NOTE — Discharge Instructions (Signed)
Avoid fried foods, fatty foods, greasy foods, and milk products until symptoms resolve. Drink plenty of clear liquids to prevent dehydration. We recommend taking a daily probiotic for management of diarrhea. You may also try over-the-counter Imodium, if desired. Follow-up with your primary doctor to ensure resolution of symptoms. We also recommend follow up with gastroenterology given your history of recurrent abdominal symptoms and your need for a screening colonoscopy.

## 2023-03-15 ENCOUNTER — Emergency Department
Admission: EM | Admit: 2023-03-15 | Discharge: 2023-03-15 | Disposition: A | Discharge status: Home / Self Care | Payer: BLUE CROSS/BLUE SHIELD | Attending: Emergency Medicine | Admitting: Emergency Medicine

## 2023-03-15 ENCOUNTER — Emergency Department: Payer: BLUE CROSS/BLUE SHIELD

## 2023-03-15 ENCOUNTER — Other Ambulatory Visit: Payer: Self-pay

## 2023-03-15 DIAGNOSIS — R197 Diarrhea, unspecified: Secondary | ICD-10-CM | POA: Diagnosis not present

## 2023-03-15 DIAGNOSIS — R1030 Lower abdominal pain, unspecified: Secondary | ICD-10-CM | POA: Diagnosis present

## 2023-03-15 DIAGNOSIS — R1032 Left lower quadrant pain: Secondary | ICD-10-CM | POA: Insufficient documentation

## 2023-03-15 DIAGNOSIS — R110 Nausea: Secondary | ICD-10-CM | POA: Diagnosis not present

## 2023-03-15 LAB — COMPREHENSIVE METABOLIC PANEL
ALT: 12 U/L (ref 0–44)
AST: 13 U/L — ABNORMAL LOW (ref 15–41)
Albumin: 4 g/dL (ref 3.5–5.0)
Alkaline Phosphatase: 65 U/L (ref 38–126)
Anion gap: 10 (ref 5–15)
BUN: 11 mg/dL (ref 6–20)
CO2: 27 mmol/L (ref 22–32)
Calcium: 9.5 mg/dL (ref 8.9–10.3)
Chloride: 103 mmol/L (ref 98–111)
Creatinine, Ser: 1.08 mg/dL — ABNORMAL HIGH (ref 0.44–1.00)
GFR, Estimated: >60 mL/min (ref >60)
Glucose, Bld: 99 mg/dL (ref 70–99)
Potassium: 3.6 mmol/L (ref 3.5–5.1)
Sodium: 140 mmol/L (ref 135–145)
Total Bilirubin: 0.4 mg/dL (ref 0.0–1.2)
Total Protein: 7.8 g/dL (ref 6.5–8.1)

## 2023-03-15 LAB — CBC
HCT: 39.7 % (ref 36.0–46.0)
Hemoglobin: 12.1 g/dL (ref 12.0–15.0)
MCH: 24.7 pg — ABNORMAL LOW (ref 26.0–34.0)
MCHC: 30.5 g/dL (ref 30.0–36.0)
MCV: 81.2 fL (ref 80.0–100.0)
Platelets: 414 10*3/uL — ABNORMAL HIGH (ref 150–400)
RBC: 4.89 MIL/uL (ref 3.87–5.11)
RDW: 14.1 % (ref 11.5–15.5)
WBC: 9.7 10*3/uL (ref 4.0–10.5)
nRBC: 0 % (ref 0.0–0.2)

## 2023-03-15 LAB — URINALYSIS, ROUTINE W REFLEX MICROSCOPIC
Bilirubin Urine: NEGATIVE
Glucose, UA: NEGATIVE mg/dL
Ketones, ur: NEGATIVE mg/dL
Nitrite: NEGATIVE
Protein, ur: NEGATIVE mg/dL
Specific Gravity, Urine: 1.025 (ref 1.005–1.030)
pH: 5 (ref 5.0–8.0)

## 2023-03-15 LAB — TSH: TSH: 1.464 u[IU]/mL (ref 0.350–4.500)

## 2023-03-15 LAB — LIPASE, BLOOD: Lipase: 38 U/L (ref 11–51)

## 2023-03-15 MED ORDER — IOHEXOL 300 MG/ML  SOLN
100.0000 mL | Freq: Once | INTRAMUSCULAR | Status: AC | PRN
Start: 2023-03-15 — End: 2023-03-15
  Administered 2023-03-15: 100 mL via INTRAVENOUS

## 2023-03-15 NOTE — ED Triage Notes (Signed)
 C/O abdominal pain and diarrhea intermittently x 1 month.

## 2023-03-15 NOTE — ED Provider Notes (Addendum)
 [facility] Provider Note    Event Date/Time   First MD Initiated Contact with Patient 03/15/23 1721     (approximate)   History   Abdominal Pain   HPI  Linda Giles is a 58 y.o. female no significant past medical history who presents to the emergency department with abdominal pain.  Patient states that she has been having diarrhea that has been ongoing for the past 1 month.  Took Imodium which has had some improvement.  Also taking Tylenol for her abdominal pain and has had some improvement with Tylenol.  Worsening pain to her lower abdomen today which brought her into the emergency department.  Endorses some nausea but no episodes of vomiting.  Denies fever.  Denies dysuria, urinary urgency or frequency.  States that she has not followed with a primary care physician and she is past due for colonoscopies.  Denies any blood in her stool.  Denies any unexplained weight gain or weight loss.  Does state that her weight has fluctuated.     Physical Exam   Triage Vital Signs: ED Triage Vitals  Encounter Vitals Group     BP 03/15/23 1553 (!) 183/79     Systolic BP Percentile --      Diastolic BP Percentile --      Pulse Rate 03/15/23 1553 75     Resp 03/15/23 1553 16     Temp 03/15/23 1553 98.5 F (36.9 C)     Temp Source 03/15/23 1553 Oral     SpO2 03/15/23 1553 100 %     Weight 03/15/23 1552 287 lb 0.6 oz (130.2 kg)     Height --      Head Circumference --      Peak Flow --      Pain Score 03/15/23 1552 8     Pain Loc --      Pain Education --      Exclude from Growth Chart --     Most recent vital signs: Vitals:   03/15/23 1815 03/15/23 1816  BP:  (!) 155/84  Pulse: 72   Resp:    Temp:    SpO2: 100%     Physical Exam Constitutional:      Appearance: She is well-developed.  HENT:     Head: Atraumatic.  Eyes:     Conjunctiva/sclera: Conjunctivae normal.  Cardiovascular:     Rate and Rhythm: Regular rhythm.  Pulmonary:      Effort: No respiratory distress.  Abdominal:     General: There is no distension.     Tenderness: There is abdominal tenderness in the left lower quadrant.  Musculoskeletal:        General: Normal range of motion.     Cervical back: Normal range of motion.  Skin:    General: Skin is warm.  Neurological:     Mental Status: She is alert. Mental status is at baseline.     IMPRESSION / MDM / ASSESSMENT AND PLAN / ED COURSE  I reviewed the triage vital signs and the nursing notes.  Differential diagnosis including diverticulitis, hypothyroidism, IBS, IBD, malignancy.  Patient without any recent antibiotic use, have a low suspicion for C. difficile.  No recent trip or travel.    RADIOLOGY I independently reviewed imaging, my interpretation of imaging: CT scan without obvious abnormalities.  CT scan was read as no acute findings.  Discussed incidental findings with the patient with aortic atherosclerosis, hiatal hernia and hepatic steatosis.  LABS (  all labs ordered are listed, but only abnormal results are displayed) Labs interpreted as -    Labs Reviewed  COMPREHENSIVE METABOLIC PANEL - Abnormal; Notable for the following components:      Result Value   Creatinine, Ser 1.08 (*)    AST 13 (*)    All other components within normal limits  CBC - Abnormal; Notable for the following components:   MCH 24.7 (*)    Platelets 414 (*)    All other components within normal limits  URINALYSIS, ROUTINE W REFLEX MICROSCOPIC - Abnormal; Notable for the following components:   Color, Urine YELLOW (*)    APPearance HAZY (*)    Hgb urine dipstick MODERATE (*)    Leukocytes,Ua MODERATE (*)    Bacteria, UA RARE (*)    All other components within normal limits  GASTROINTESTINAL PANEL BY PCR, STOOL (REPLACES STOOL CULTURE)  LIPASE, BLOOD  TSH     MDM  Ordered a GI pathogen panel however patient was unable to provide a sample while in the emergency department.  Patient's lab work overall  reassuring with no significant leukocytosis or anemia.  Creatinine at baseline with no significant electrolyte abnormalities.  Normal LFTs.  Normal lipase.  UA with questionable findings of urinary tract infection however patient does not have any symptoms.  Do not feel that urine culture or antibiotics are necessary at this time.  CT scan abdomen and pelvis without acute findings.  On reevaluation continues to feel okay with no further episodes of nausea or vomiting.  No episodes of diarrhea in the emergency department.  Patient given a referral for primary care and discussed follow-up as an outpatient with primary care.  Also given information to follow-up with gastroenterology given that the patient is overdue for colonoscopy.  Discussed return for any worsening symptoms.     PROCEDURES:  Critical Care performed: No  Procedures  Patient's presentation is most consistent with acute presentation with potential threat to life or bodily function.   MEDICATIONS ORDERED IN ED: Medications  iohexol (OMNIPAQUE) 300 MG/ML solution 100 mL (100 mLs Intravenous Contrast Given 03/15/23 1812)    FINAL CLINICAL IMPRESSION(S) / ED DIAGNOSES   Final diagnoses:  Left lower quadrant abdominal pain  Diarrhea, unspecified type     Rx / DC Orders   ED Discharge Orders          Ordered    Ambulatory Referral to Primary Care (Establish Care)        03/15/23 1902             Note:  This document was prepared using Dragon voice recognition software and may include unintentional dictation errors.   Corena Herter, MD 03/15/23 [place]    Corena Herter, MD 03/15/23 [phone]

## 2023-03-15 NOTE — Discharge Instructions (Signed)
 You were seen in the emergency department for abdominal pain and diarrhea.  Your lab work was normal.  You had a CT scan done that did not show any abnormalities to explain your abdominal pain today.  You had findings of hepatic steatosis which is a fatty liver and a hiatal hernia which can cause acid reflux.  He also had some findings of aortic atherosclerosis.  It is importantly follow-up with a primary care physician about these findings.  You need to have your routine lab work checked.  Your blood pressure was mildly elevated in the emergency department, have it rechecked with a primary care physician to determine if you need to be started on a blood pressure medication.  Return to the emergency department if you have any worsening/ongoing symptoms.  Thank you for choosing [place] for your health care, it was my pleasure to care for you today!  Corena Herter, MD

## 2023-09-07 ENCOUNTER — Other Ambulatory Visit: Payer: Self-pay

## 2023-09-07 ENCOUNTER — Emergency Department
Admission: EM | Admit: 2023-09-07 | Discharge: 2023-09-07 | Disposition: A | Discharge status: Home / Self Care | Payer: Self-pay | Attending: Emergency Medicine | Admitting: Emergency Medicine

## 2023-09-07 DIAGNOSIS — M1990 Unspecified osteoarthritis, unspecified site: Secondary | ICD-10-CM | POA: Insufficient documentation

## 2023-09-07 DIAGNOSIS — I10 Essential (primary) hypertension: Secondary | ICD-10-CM | POA: Insufficient documentation

## 2023-09-07 DIAGNOSIS — M5431 Sciatica, right side: Secondary | ICD-10-CM | POA: Insufficient documentation

## 2023-09-07 DIAGNOSIS — G8929 Other chronic pain: Secondary | ICD-10-CM

## 2023-09-07 HISTORY — DX: Unspecified osteoarthritis, unspecified site: M19.90

## 2023-09-07 MED ORDER — PREDNISONE 10 MG PO TABS: 10.0000 mg | ORAL_TABLET | Freq: Every day | ORAL | 0 refills | Status: AC

## 2023-09-07 MED ORDER — HYDROCODONE-ACETAMINOPHEN 5-325 MG PO TABS: 1.0000 | ORAL_TABLET | ORAL | 0 refills | Status: AC | PRN

## 2023-09-07 MED ORDER — OXYCODONE-ACETAMINOPHEN 5-325 MG PO TABS
1.0000 | ORAL_TABLET | Freq: Once | ORAL | Status: AC
  Administered 2023-09-07: 1 via ORAL
  Filled 2023-09-07: qty 1

## 2023-09-07 MED ORDER — PREDNISONE 20 MG PO TABS
60.0000 mg | ORAL_TABLET | Freq: Once | ORAL | Status: AC
  Administered 2023-09-07: 60 mg via ORAL
  Filled 2023-09-07: qty 3

## 2023-09-07 NOTE — ED Triage Notes (Signed)
 Pt to ED for R shoulder pain and R knee pain. Also has R buttock pain that shoots down R leg. Has been seen for this before, has tried antiinflammatories and muscles relaxers. Pain worse with movement. Has been sitting a lot to avoid pain. Hx arthritis. Pain ongoing for years.

## 2023-09-07 NOTE — ED Provider Notes (Signed)
 [facility] Provider Note    Event Date/Time   First MD Initiated Contact with Patient 09/07/23 1759     (approximate)  History   Chief Complaint: Shoulder Pain and Knee Pain  HPI  Linda Giles is a 58 y.o. female with a past medical history of chronic back pain, hypertension, arthritis, presents to the emergency department for worsening pain in her back and radiating down her leg.  Patient states a history of chronic pain, for the last few months she has been experiencing pain in her right shoulder right knee and has been experiencing worsening pain in her right lower back now with some radiation down the right leg.  Patient states she has seen her doctor for the same has seen neurology for the same at a point was prescribed muscle relaxers but states they were not helping.  Patient denies any fever.  Patient was able to ambulate.  Physical Exam   Triage Vital Signs: ED Triage Vitals  Encounter Vitals Group     BP 09/07/23 1611 (!) 154/75     Girls Systolic BP Percentile --      Girls Diastolic BP Percentile --      Boys Systolic BP Percentile --      Boys Diastolic BP Percentile --      Pulse Rate 09/07/23 1611 60     Resp 09/07/23 1611 20     Temp 09/07/23 1611 98.4 F (36.9 C)     Temp Source 09/07/23 1611 Oral     SpO2 09/07/23 1611 100 %     Weight 09/07/23 1614 225 lb (102.1 kg)     Height 09/07/23 1614 5' 2 (1.575 m)     Head Circumference --      Peak Flow --      Pain Score 09/07/23 1612 10     Pain Loc --      Pain Education --      Exclude from Growth Chart --     Most recent vital signs: Vitals:   09/07/23 1611  BP: (!) 154/75  Pulse: 60  Resp: 20  Temp: 98.4 F (36.9 C)  SpO2: 100%    General: Awake, no distress.  CV:  Good peripheral perfusion Resp:  Normal effort.   Abd:  No distention.   Other:  No swelling or joint effusion of the right knee.  Sensation intact.   ED Results / Procedures / Treatments    MEDICATIONS ORDERED IN ED: Medications  oxyCODONE -acetaminophen  (PERCOCET/ROXICET) 5-325 MG per tablet 1 tablet (has no administration in time range)  predniSONE  (DELTASONE ) tablet 60 mg (has no administration in time range)     IMPRESSION / MDM / ASSESSMENT AND PLAN / ED COURSE  I reviewed the triage vital signs and the nursing notes.  Patient's presentation is most consistent with severe exacerbation of chronic illness.  Patient presents to the emergency department for worsening pain of her right shoulder lower back and right knee.  States this is a chronic problem but has been progressively worsening over the last 2 weeks.  I discussed with the patient the need to follow-up with PM&R for further evaluation and treatment.  I do believe the patient could benefit from a prednisone  taper as well as a short course of pain medication.  No red flags on history or physical exam.  Suspect likely radicular pain such as sciatica.  FINAL CLINICAL IMPRESSION(S) / ED DIAGNOSES   Sciatica Arthritis  Note:  This document  was prepared using Conservation officer, historic buildings and may include unintentional dictation errors.   Dorothyann Drivers, MD 09/07/23 [phone]

## 2023-10-27 ENCOUNTER — Encounter: Payer: Self-pay | Admitting: Emergency Medicine

## 2023-10-27 ENCOUNTER — Emergency Department
Admission: EM | Admit: 2023-10-27 | Discharge: 2023-10-27 | Disposition: A | Discharge status: Home / Self Care | Payer: Self-pay | Attending: Emergency Medicine | Admitting: Emergency Medicine

## 2023-10-27 ENCOUNTER — Other Ambulatory Visit: Payer: Self-pay

## 2023-10-27 DIAGNOSIS — R55 Syncope and collapse: Secondary | ICD-10-CM | POA: Insufficient documentation

## 2023-10-27 DIAGNOSIS — I10 Essential (primary) hypertension: Secondary | ICD-10-CM | POA: Insufficient documentation

## 2023-10-27 LAB — TROPONIN I (HIGH SENSITIVITY)
Troponin I (High Sensitivity): 3 ng/L (ref <18)
Troponin I (High Sensitivity): 4 ng/L (ref <18)

## 2023-10-27 LAB — CBC
HCT: 40.2 % (ref 36.0–46.0)
Hemoglobin: 12.5 g/dL (ref 12.0–15.0)
MCH: 25 pg — ABNORMAL LOW (ref 26.0–34.0)
MCHC: 31.1 g/dL (ref 30.0–36.0)
MCV: 80.4 fL (ref 80.0–100.0)
Platelets: 428 K/uL — ABNORMAL HIGH (ref 150–400)
RBC: 5 MIL/uL (ref 3.87–5.11)
RDW: 14.3 % (ref 11.5–15.5)
WBC: 8.3 K/uL (ref 4.0–10.5)
nRBC: 0 % (ref 0.0–0.2)

## 2023-10-27 LAB — URINALYSIS, ROUTINE W REFLEX MICROSCOPIC
Bilirubin Urine: NEGATIVE
Glucose, UA: NEGATIVE mg/dL
Hgb urine dipstick: NEGATIVE
Ketones, ur: NEGATIVE mg/dL
Leukocytes,Ua: NEGATIVE
Nitrite: NEGATIVE
Protein, ur: NEGATIVE mg/dL
Specific Gravity, Urine: 1.019 (ref 1.005–1.030)
pH: 5 (ref 5.0–8.0)

## 2023-10-27 LAB — COMPREHENSIVE METABOLIC PANEL WITH GFR
ALT: 13 U/L (ref 0–44)
AST: 18 U/L (ref 15–41)
Albumin: 3.9 g/dL (ref 3.5–5.0)
Alkaline Phosphatase: 67 U/L (ref 38–126)
Anion gap: 10 (ref 5–15)
BUN: 12 mg/dL (ref 6–20)
CO2: 27 mmol/L (ref 22–32)
Calcium: 9.1 mg/dL (ref 8.9–10.3)
Chloride: 101 mmol/L (ref 98–111)
Creatinine, Ser: 1.04 mg/dL — ABNORMAL HIGH (ref 0.44–1.00)
GFR, Estimated: >60 mL/min (ref >60)
Glucose, Bld: 109 mg/dL — ABNORMAL HIGH (ref 70–99)
Potassium: 3.3 mmol/L — ABNORMAL LOW (ref 3.5–5.1)
Sodium: 138 mmol/L (ref 135–145)
Total Bilirubin: 0.5 mg/dL (ref 0.0–1.2)
Total Protein: 7.9 g/dL (ref 6.5–8.1)

## 2023-10-27 LAB — CBG MONITORING, ED: Glucose-Capillary: 95 mg/dL (ref 70–99)

## 2023-10-27 MED ORDER — POTASSIUM CHLORIDE CRYS ER 20 MEQ PO TBCR
60.0000 meq | EXTENDED_RELEASE_TABLET | Freq: Once | ORAL | Status: AC
  Administered 2023-10-27: 60 meq via ORAL
  Filled 2023-10-27: qty 3

## 2023-10-27 NOTE — ED Notes (Signed)
 Pt up to use restroom, pt stating feeling better but does still feel a little dizzy when standing.

## 2023-10-27 NOTE — ED Provider Notes (Signed)
 [facility] Provider Note    Event Date/Time   First MD Initiated Contact with Patient 10/27/23 1631     (approximate)   History   Chief Complaint: Near Syncope   HPI  Linda Giles is a 58 y.o. female with a history of hypertension who reports being in her usual state of health until being at work as an International aid/development worker at a [employer] today where she had episodes of feeling hot and flushed and sweaty, lightheaded.  No chest pain or shortness of breath.  No specific palpitations.  No vomiting or diarrhea but she did feel nauseated.  She was helped to a chair and rested, did not fall.  Currently feels better.        Past Medical History:  Diagnosis Date   Arthritis    Chronic back pain    Hypertension    Reflux gastritis     Current Outpatient Rx   Order #: [id number] Class: Historical Med   Order #: [id number] Class: Normal   Order #: [id number] Class: Normal   Order #: [id number] Class: Historical Med   Order #: [id number] Class: Historical Med   Order #: [id number] Class: Normal   Order #: [id number] Class: Normal    Past Surgical History:  Procedure Laterality Date   ABDOMINAL HYSTERECTOMY     LAPAROSCOPIC CHOLECYSTECTOMY  04/19/2020    Physical Exam   Triage Vital Signs: ED Triage Vitals  Encounter Vitals Group     BP 10/27/23 1539 (!) 160/76     Girls Systolic BP Percentile --      Girls Diastolic BP Percentile --      Boys Systolic BP Percentile --      Boys Diastolic BP Percentile --      Pulse Rate 10/27/23 1539 70     Resp 10/27/23 1539 16     Temp 10/27/23 1539 99.5 F (37.5 C)     Temp Source 10/27/23 1539 Oral     SpO2 10/27/23 1539 100 %     Weight 10/27/23 1534 185 lb (83.9 kg)     Height 10/27/23 1534 5' 6 (1.676 m)     Head Circumference --      Peak Flow --      Pain Score 10/27/23 1534 3     Pain Loc --      Pain Education --      Exclude from Growth Chart --     Most recent vital signs: Vitals:   10/27/23  1830 10/27/23 1921  BP: (!) 180/75 99/75  Pulse: (!) 58 62  Resp: 12 16  Temp:    SpO2: 100% 100%    General: Awake, no distress.  CV:  Good peripheral perfusion.  Regular rate rhythm, normal distal pulses Resp:  Normal effort.  Clear lungs Abd:  No distention.  Soft nontender Other:  Moist oral mucosa   ED Results / Procedures / Treatments   Labs (all labs ordered are listed, but only abnormal results are displayed) Labs Reviewed  COMPREHENSIVE METABOLIC PANEL WITH GFR - Abnormal; Notable for the following components:      Result Value   Potassium 3.3 (*)    Glucose, Bld 109 (*)    Creatinine, Ser 1.04 (*)    All other components within normal limits  CBC - Abnormal; Notable for the following components:   MCH 25.0 (*)    Platelets 428 (*)    All other components within normal limits  URINALYSIS, ROUTINE W REFLEX MICROSCOPIC - Abnormal; Notable  for the following components:   Color, Urine YELLOW (*)    APPearance CLEAR (*)    All other components within normal limits  CBG MONITORING, ED  TROPONIN I (HIGH SENSITIVITY)  TROPONIN I (HIGH SENSITIVITY)     EKG Interpreted by me Sinus rhythm rate of 74.  Normal axis intervals QRS ST segments T waves   RADIOLOGY    PROCEDURES:  Procedures   MEDICATIONS ORDERED IN ED: Medications  potassium chloride  SA (KLOR-CON  M) CR tablet 60 mEq (60 mEq Oral Given 10/27/23 1736)     IMPRESSION / MDM / ASSESSMENT AND PLAN / ED COURSE  I reviewed the triage vital signs and the nursing notes.  DDx: Dehydration, AKI, electrolyte derangement, anemia, viral illness, vasovagal episode, paroxysmal arrhythmia  Patient's presentation is most consistent with acute presentation with potential threat to life or bodily function.  Patient presents with presyncope while at work.  Suspect vagal episode versus paroxysmal arrhythmia.  In the ED vital signs are essentially normal, normal sinus rhythm on the EKG and monitor.  Labs all  unremarkable.  Will repeat troponin, if trend is flat she will be stable for discharge with referral to cardiology.    ----------------------------------------- 7:22 PM on 10/27/2023 ----------------------------------------- Repeat troponin normal, stable for discharge.     FINAL CLINICAL IMPRESSION(S) / ED DIAGNOSES   Final diagnoses:  Near syncope     Rx / DC Orders   ED Discharge Orders          Ordered    Ambulatory referral to Cardiology       Comments: If you have not heard from the Cardiology office within the next 72 hours please call [phone].   10/27/23 1922             Note:  This document was prepared using Dragon voice recognition software and may include unintentional dictation errors.   Viviann Pastor, MD 10/27/23 [place]

## 2023-10-27 NOTE — ED Triage Notes (Signed)
 Pt in via POV, reports being at work, becoming dizzy to the point of needing to sit down, later with second episode where coworker reports patient was drifting to right as if she was going to fall.  Patient was able to get seated, reports feeling as if her heart was racing and was about to pass out.  Patient arrives feeling the same, is very clammy.  Denies any cardiac hx.

## 2023-10-27 NOTE — ED Notes (Signed)
 Pt given warm blankets.

## 2023-11-17 DIAGNOSIS — R55 Syncope and collapse: Secondary | ICD-10-CM | POA: Insufficient documentation

## 2023-11-17 NOTE — Progress Notes (Deleted)
 NO SHOW

## 2023-11-18 ENCOUNTER — Ambulatory Visit: Payer: Self-pay | Attending: Cardiovascular Disease | Admitting: Cardiovascular Disease

## 2023-11-18 DIAGNOSIS — R55 Syncope and collapse: Secondary | ICD-10-CM

## 2023-12-02 NOTE — Progress Notes (Deleted)
 NO SHOW

## 2023-12-03 ENCOUNTER — Ambulatory Visit: Payer: Self-pay | Attending: Cardiovascular Disease | Admitting: Cardiovascular Disease

## 2023-12-03 DIAGNOSIS — R55 Syncope and collapse: Secondary | ICD-10-CM

## 2023-12-03 DIAGNOSIS — R079 Chest pain, unspecified: Secondary | ICD-10-CM

## 2024-01-02 ENCOUNTER — Emergency Department
Admission: EM | Admit: 2024-01-02 | Discharge: 2024-01-02 | Disposition: A | Discharge status: Home / Self Care | Payer: Self-pay | Attending: Emergency Medicine | Admitting: Emergency Medicine

## 2024-01-02 ENCOUNTER — Emergency Department: Payer: Self-pay

## 2024-01-02 ENCOUNTER — Other Ambulatory Visit: Payer: Self-pay

## 2024-01-02 DIAGNOSIS — R0789 Other chest pain: Secondary | ICD-10-CM

## 2024-01-02 DIAGNOSIS — I10 Essential (primary) hypertension: Secondary | ICD-10-CM | POA: Insufficient documentation

## 2024-01-02 DIAGNOSIS — R059 Cough, unspecified: Secondary | ICD-10-CM | POA: Insufficient documentation

## 2024-01-02 DIAGNOSIS — B349 Viral infection, unspecified: Secondary | ICD-10-CM | POA: Insufficient documentation

## 2024-01-02 LAB — CBC
HCT: 41.1 % (ref 36.0–46.0)
Hemoglobin: 12.7 g/dL (ref 12.0–15.0)
MCH: 24.7 pg — ABNORMAL LOW (ref 26.0–34.0)
MCHC: 30.9 g/dL (ref 30.0–36.0)
MCV: 80 fL (ref 80.0–100.0)
Platelets: 399 K/uL (ref 150–400)
RBC: 5.14 MIL/uL — ABNORMAL HIGH (ref 3.87–5.11)
RDW: 13.8 % (ref 11.5–15.5)
WBC: 9.5 K/uL (ref 4.0–10.5)
nRBC: 0 % (ref 0.0–0.2)

## 2024-01-02 LAB — BASIC METABOLIC PANEL WITH GFR
Anion gap: 11 (ref 5–15)
BUN: 9 mg/dL (ref 6–20)
CO2: 27 mmol/L (ref 22–32)
Calcium: 9.6 mg/dL (ref 8.9–10.3)
Chloride: 103 mmol/L (ref 98–111)
Creatinine, Ser: 0.82 mg/dL (ref 0.44–1.00)
GFR, Estimated: >60 mL/min (ref >60)
Glucose, Bld: 107 mg/dL — ABNORMAL HIGH (ref 70–99)
Potassium: 3.9 mmol/L (ref 3.5–5.1)
Sodium: 141 mmol/L (ref 135–145)

## 2024-01-02 LAB — TROPONIN T, HIGH SENSITIVITY
Troponin T High Sensitivity: <15 ng/L (ref 0–19)
Troponin T High Sensitivity: <15 ng/L (ref 0–19)

## 2024-01-02 LAB — RESP PANEL BY RT-PCR (RSV, FLU A&B, COVID)  RVPGX2
Influenza A by PCR: NEGATIVE
Influenza B by PCR: NEGATIVE
Resp Syncytial Virus by PCR: NEGATIVE
SARS Coronavirus 2 by RT PCR: NEGATIVE

## 2024-01-02 NOTE — ED Triage Notes (Signed)
 Pt to ED for chest tightness for a few days. States decided to come in today because boss told her to. +shob.

## 2024-01-02 NOTE — ED Provider Notes (Addendum)
 [facility] Provider Note    Event Date/Time   First MD Initiated Contact with Patient 01/02/24 1232     (approximate)   History   Chest Pain   HPI  Linda Giles is a 58 y.o. female who comes in for chest tightness over the past few days.  Patient's boss told them to come in to get evaluated.  Patient reports having some intermittent chest tightness.  She reports that she did a lot of lifting so she would consider it related to having strained a muscle.  She also reports being under a lot of stress as a family member was just recently diagnosed with an illness.  She states that today she woke up with sinus congestion, coughing and she went to work and felt some of the chest tightness again with a mild amount of shortness of breath.  She denies any chest pain or shortness of breath at this time.  He stated that it only lasted a few minutes and then went away.  She denies any numbness, tingling or any other concerns.  At this time she reports feeling her normal self other than the congestion and cough.  She reports prior history of hypertension but she has not seen a doctor for some time now.  She reports that she was previously on hydrochlorothiazide but she has not been taking it recently.  She denies any coughing up blood, respecters for pulmonary embolism, swelling in the legs, recent long travel, recent surgery, estrogen therapy.  Patient denies it being exertional chest pain.  She reports that the pain comes on sometimes at rest which is while she was watching TV.  No cocaine, drug, smoking history  Physical Exam   Triage Vital Signs: ED Triage Vitals  Encounter Vitals Group     BP 01/02/24 1016 (!) 185/76     Girls Systolic BP Percentile --      Girls Diastolic BP Percentile --      Boys Systolic BP Percentile --      Boys Diastolic BP Percentile --      Pulse Rate 01/02/24 1016 94     Resp 01/02/24 1016 20     Temp 01/02/24 1015 98.9 F (37.2  C)     Temp src --      SpO2 01/02/24 1016 99 %     Weight 01/02/24 1014 185 lb (83.9 kg)     Height 01/02/24 1014 5' 7 (1.702 m)     Head Circumference --      Peak Flow --      Pain Score 01/02/24 1014 5     Pain Loc --      Pain Education --      Exclude from Growth Chart --     Most recent vital signs: Vitals:   01/02/24 1015 01/02/24 1016  BP:  (!) 185/76  Pulse:  94  Resp:  20  Temp: 98.9 F (37.2 C)   SpO2:  99%     General: Awake, no distress.  CV:  Good peripheral perfusion.  No murmurs Resp:  Normal effort.  Clear lung. Occasionally coughing.  Abd:  No distention.  Soft and nontender Other:  No swelling in legs.  No calf tenderness Equal DPs equal PTs Sensation intact throughout.   ED Results / Procedures / Treatments   Labs (all labs ordered are listed, but only abnormal results are displayed) Labs Reviewed  BASIC METABOLIC PANEL WITH GFR - Abnormal; Notable for  the following components:      Result Value   Glucose, Bld 107 (*)    All other components within normal limits  CBC - Abnormal; Notable for the following components:   RBC 5.14 (*)    MCH 24.7 (*)    All other components within normal limits  TROPONIN T, HIGH SENSITIVITY  TROPONIN T, HIGH SENSITIVITY     EKG  My interpretation of EKG:  Normal sinus rhythm 89 without any ST elevation or T wave inversions, normal intervals  RADIOLOGY I have reviewed the xray personally and interpreted no evidence of any pneumonia   PROCEDURES:  Critical Care performed: No  Procedures   MEDICATIONS ORDERED IN ED: Medications - No data to display   IMPRESSION / MDM / ASSESSMENT AND PLAN / ED COURSE  I reviewed the triage vital signs and the nursing notes.   Patient's presentation is most consistent with acute presentation with potential threat to life or bodily function.      INITIAL IMPRESSION / ASSESSMENT AND PLAN / ED COURSE   Most Likely DDx:  -Consider ACS vs MSK Vs Thalia- will  get EKG/troponin to evaluate for ACS       DDx that was also considered d/t potential to cause harm, but was found less likely based on history and physical (as detailed above): -PNA (no fevers, cough but CXR to evaluate) -PNX (reassured with equal b/l breath sounds, CXR to evaluate) -Symptomatic anemia (will get H&H) -Pulmonary embolism as no sob at rest, not pleuritic in nature, no hypoxia -Aortic Dissection as no tearing pain and no radiation to the mid back, pulses equal, no murmur -Pericarditis no EKG changes or hx to suggest dx -Tamponade (no notable SOB, tachycardic, hypotensive) -Esophageal rupture (no h/o diffuse vomitting/no crepitus)    Troponin is negative.  BMP is reassuring CBC is normal.  Discussed with patient D-dimer and evaluate for pulmonary embolism but patient has a very low risk I suspect that her mild shortness of breath earlier was more related to viral illness.  She denies any shortness of breath or chest pain at this time and patient did not want to undergo D-dimer testing.  At this time given re-assuring workup I have considered CT imaging to rule out PE/Dissection but given history and physical exam these seem less likely and potential harm from CT would outweigh the probability of finding PE or Dissection.    I have considered admission for patient, but given re-assuring workup including EKG, troponin, patient can follow-up outpatient with cardiology.  We did discuss her elevated blood pressure but again she is asymptomatic and I have low suspicion for dissection at this time.  She reports previously being on hydrochlorothiazide.  Unclear what dose.  Will start a low-dose at 12.5 and have her follow-up with cardiology.  She expressed understanding and was comfortable with this plan   Discussed with patient that I can not predict future heart attacks and that cardiology can evaluate for need for further workup including stress test.  Explained to patient that if  there is a change in symptoms, worsening symptoms, or any other concerns that they should return for repeat evaluation to have repeat EKG/troponin. They expressed understanding.  ____________________________________________  COVID test is pending and she can follow this up in MyChart.   Note:  This document was prepared using Dragon voice recognition software and may include unintentional dictation errors.   The patient is on the cardiac monitor to evaluate for evidence of arrhythmia and/or  significant heart rate changes.      FINAL CLINICAL IMPRESSION(S) / ED DIAGNOSES   Final diagnoses:  Atypical chest pain  Viral illness  Uncontrolled hypertension     Rx / DC Orders   ED Discharge Orders          Ordered    Ambulatory referral to Cardiology        01/02/24 1308    hydrochlorothiazide (HYDRODIURIL) 12.5 MG tablet  Daily        01/02/24 1308             Note:  This document was prepared using Dragon voice recognition software and may include unintentional dictation errors.   Ernest Ronal BRAVO, MD 01/02/24 1311    Ernest Ronal BRAVO, MD 01/02/24 1312    Ernest Ronal BRAVO, MD 01/02/24 1313    Ernest Ronal BRAVO, MD 01/02/24 [phone]

## 2024-01-02 NOTE — Discharge Instructions (Addendum)
 Your workup was reassuring but if you develop any worsening chest pain, shortness of breath or any other concerns please return to the ER as you may need further workup for evaluation.  We did discuss your elevated blood pressure and start you on a low-dose of the hydrochlorothiazide but you will need to follow-up with cardiology to discuss your blood pressure further and to ensure they do not want any type of stress test given your intermittent chest tightness.   Your COVID test are pending and should follow these up in MyChart
